# Patient Record
Sex: Female | Born: 1935 | Race: White | Hispanic: No | Smoking: Never smoker
Health system: Southern US, Community
[De-identification: ages and names within clinical notes are randomized; demographics above are authoritative.]

## PROBLEM LIST (undated history)

## (undated) DIAGNOSIS — I10 Essential (primary) hypertension: Secondary | ICD-10-CM

---

## 2021-05-30 DIAGNOSIS — E871 Hypo-osmolality and hyponatremia: Secondary | ICD-10-CM

## 2021-07-29 ENCOUNTER — Ambulatory Visit (INDEPENDENT_AMBULATORY_CARE_PROVIDER_SITE_OTHER): Payer: Medicare Other | Admitting: Sports Medicine

## 2021-07-29 ENCOUNTER — Encounter: Payer: Self-pay | Admitting: Sports Medicine

## 2021-07-29 ENCOUNTER — Other Ambulatory Visit: Payer: Self-pay

## 2021-07-29 DIAGNOSIS — Z7409 Other reduced mobility: Secondary | ICD-10-CM | POA: Insufficient documentation

## 2021-07-29 DIAGNOSIS — R0989 Other specified symptoms and signs involving the circulatory and respiratory systems: Secondary | ICD-10-CM | POA: Insufficient documentation

## 2021-07-29 DIAGNOSIS — H814 Vertigo of central origin: Secondary | ICD-10-CM | POA: Insufficient documentation

## 2021-07-29 DIAGNOSIS — M179 Osteoarthritis of knee, unspecified: Secondary | ICD-10-CM | POA: Insufficient documentation

## 2021-07-29 DIAGNOSIS — E782 Mixed hyperlipidemia: Secondary | ICD-10-CM | POA: Insufficient documentation

## 2021-07-29 DIAGNOSIS — I10 Essential (primary) hypertension: Secondary | ICD-10-CM | POA: Insufficient documentation

## 2021-07-29 DIAGNOSIS — M5136 Other intervertebral disc degeneration, lumbar region: Secondary | ICD-10-CM | POA: Insufficient documentation

## 2021-07-29 DIAGNOSIS — G894 Chronic pain syndrome: Secondary | ICD-10-CM | POA: Insufficient documentation

## 2021-07-29 DIAGNOSIS — I739 Peripheral vascular disease, unspecified: Secondary | ICD-10-CM | POA: Diagnosis not present

## 2021-07-29 DIAGNOSIS — L600 Ingrowing nail: Secondary | ICD-10-CM

## 2021-07-29 DIAGNOSIS — Z9181 History of falling: Secondary | ICD-10-CM | POA: Insufficient documentation

## 2021-07-29 DIAGNOSIS — G4700 Insomnia, unspecified: Secondary | ICD-10-CM | POA: Insufficient documentation

## 2021-07-29 DIAGNOSIS — I872 Venous insufficiency (chronic) (peripheral): Secondary | ICD-10-CM | POA: Insufficient documentation

## 2021-07-29 DIAGNOSIS — L89151 Pressure ulcer of sacral region, stage 1: Secondary | ICD-10-CM | POA: Insufficient documentation

## 2021-07-29 DIAGNOSIS — M71552 Other bursitis, not elsewhere classified, left hip: Secondary | ICD-10-CM | POA: Insufficient documentation

## 2021-07-29 DIAGNOSIS — F411 Generalized anxiety disorder: Secondary | ICD-10-CM | POA: Insufficient documentation

## 2021-07-29 DIAGNOSIS — J309 Allergic rhinitis, unspecified: Secondary | ICD-10-CM | POA: Insufficient documentation

## 2021-07-29 DIAGNOSIS — G2581 Restless legs syndrome: Secondary | ICD-10-CM | POA: Insufficient documentation

## 2021-07-29 DIAGNOSIS — L03032 Cellulitis of left toe: Secondary | ICD-10-CM

## 2021-07-29 DIAGNOSIS — M5416 Radiculopathy, lumbar region: Secondary | ICD-10-CM | POA: Insufficient documentation

## 2021-07-29 DIAGNOSIS — E1142 Type 2 diabetes mellitus with diabetic polyneuropathy: Secondary | ICD-10-CM

## 2021-07-29 DIAGNOSIS — D518 Other vitamin B12 deficiency anemias: Secondary | ICD-10-CM | POA: Insufficient documentation

## 2021-07-29 DIAGNOSIS — E559 Vitamin D deficiency, unspecified: Secondary | ICD-10-CM | POA: Insufficient documentation

## 2021-07-29 DIAGNOSIS — L0390 Cellulitis, unspecified: Secondary | ICD-10-CM | POA: Insufficient documentation

## 2021-07-29 DIAGNOSIS — E039 Hypothyroidism, unspecified: Secondary | ICD-10-CM | POA: Insufficient documentation

## 2021-07-29 DIAGNOSIS — M1710 Unilateral primary osteoarthritis, unspecified knee: Secondary | ICD-10-CM | POA: Insufficient documentation

## 2021-07-29 DIAGNOSIS — K219 Gastro-esophageal reflux disease without esophagitis: Secondary | ICD-10-CM | POA: Insufficient documentation

## 2021-07-29 DIAGNOSIS — E119 Type 2 diabetes mellitus without complications: Secondary | ICD-10-CM | POA: Insufficient documentation

## 2021-07-29 DIAGNOSIS — Z79811 Long term (current) use of aromatase inhibitors: Secondary | ICD-10-CM | POA: Insufficient documentation

## 2021-07-29 DIAGNOSIS — R609 Edema, unspecified: Secondary | ICD-10-CM | POA: Insufficient documentation

## 2021-07-29 DIAGNOSIS — R601 Generalized edema: Secondary | ICD-10-CM | POA: Insufficient documentation

## 2021-07-29 MED ORDER — NEOMYCIN-POLYMYXIN-HC 3.5-10000-1 OT SOLN: OTIC | 0 refills | Status: DC

## 2021-07-29 NOTE — Progress Notes (Signed)
Subjective: Jill Ochoa is a 87 y.o. female patient with history of diabetes who presents to office today complaining of pain at the left great toenail states that it has been swelling and has some dry crust over the nail and nailbed states that at the nursing facility they have put her on 2 rounds of antibiotics because of her nail issue and states that it seems not to be getting better.  Patient does not know how this is happened states that she usually trims her own toenails but since she has been in the facility has not states that she does wear compression garments and sometimes the garments hurt her toes.  Patient denies any other pedal complaints at this time.  Patient is assisted by son this visit.  Patient Active Problem List   Diagnosis Date Noted   Allergic rhinitis 07/29/2021   Cardiovascular symptoms 07/29/2021   Cellulitis 07/29/2021   Chronic pain syndrome 07/29/2021   Edema 07/29/2021   Essential hypertension 07/29/2021   Gastro-esophageal reflux disease without esophagitis 07/29/2021   Generalized anxiety disorder 07/29/2021   Generalized edema 07/29/2021   History of fall 07/29/2021   Hypothyroidism 07/29/2021   Insomnia 07/29/2021   Long term (current) use of aromatase inhibitors 07/29/2021   Lumbar radicular pain 07/29/2021   Mixed hyperlipidemia 07/29/2021   Osteoarthritis of knee 07/29/2021   Other bursitis, not elsewhere classified, left hip 07/29/2021   Other intervertebral disc degeneration, lumbar region 07/29/2021   Other vitamin B12 deficiency anemias 07/29/2021   Peripheral vascular disease (HCC) 07/29/2021   Peripheral venous insufficiency 07/29/2021   Pressure ulcer of sacral region, stage 1 07/29/2021   Reduced mobility 07/29/2021   Restless legs syndrome 07/29/2021   Type 2 diabetes mellitus without complications (HCC) 07/29/2021   Vertigo of central origin 07/29/2021   Vitamin D deficiency 07/29/2021   Current Outpatient Medications on  File Prior to Visit  Medication Sig Dispense Refill   meclizine (ANTIVERT) 25 MG tablet 1 tablet as needed     metFORMIN (GLUCOPHAGE) 500 MG tablet 1 tablet with a meal     predniSONE (DELTASONE) 20 MG tablet 1 tablet     acyclovir (ZOVIRAX) 400 MG tablet Take 400 mg by mouth daily.     ALPRAZolam (XANAX) 1 MG tablet Take 1 mg by mouth 2 (two) times daily as needed.     amLODipine (NORVASC) 5 MG tablet Take 5 mg by mouth daily.     B Complex Vitamins (VITAMIN B COMPLEX) TABS Take 1 tablet by mouth daily.     furosemide (LASIX) 20 MG tablet Take 20 mg by mouth daily.     gabapentin (NEURONTIN) 800 MG tablet Take 800 mg by mouth 3 (three) times daily.     ibuprofen (ADVIL) 800 MG tablet Take 800 mg by mouth 3 (three) times daily.     lisinopril (ZESTRIL) 30 MG tablet Take 30 mg by mouth daily.     meloxicam (MOBIC) 15 MG tablet 1 tablet     nirmatrelvir & ritonavir (PAXLOVID) 20 x 150 MG & 10 x 100MG  TBPK Take 3 tablets by mouth 2 (two) times daily.     omeprazole (PRILOSEC) 40 MG capsule Take 40 mg by mouth daily.     phenazopyridine (PYRIDIUM) 200 MG tablet Take 200 mg by mouth every 8 (eight) hours as needed.     traMADol (ULTRAM) 50 MG tablet SMARTSIG:1 Tablet(s) By Mouth Every 12 Hours PRN     No current facility-administered medications on file  prior to visit.   Allergies  Allergen Reactions   Tape     No results found for this or any previous visit (from the past 2160 hour(s)).  Objective: General: Patient is awake, alert, and oriented x 3 and in no acute distress.  Integument: Skin is warm, dry and supple bilateral.  Left hallux nail has keratotic skin at the lateral nail fold with dried crusted blood to the area consistent with paronychia there is no significant erythema but patient has diffuse 2+ pitting edema bilateral. No open lesions or preulcerative lesions present bilateral. Remaining integument unremarkable.  Vasculature:  Dorsalis Pedis pulse 0/4 bilateral. Posterior  Tibial pulse  0/4 bilateral. Capillary fill time <5 sec 1-5 bilateral.  No hair growth to the level of the digits. Temperature gradient within normal limits.  Moderate varicosities present bilateral.  2+ pitting edema present bilateral.   Neurology: The patient has diminished sensation bilateral.  Musculoskeletal: Minimal tenderness to palpation to left great toe and no obvious symptomatic bony deformities noted bilateral.    Assessment and Plan: Problem List Items Addressed This Visit   None Visit Diagnoses     Paronychia of great toe, left    -  Primary   Relevant Medications   acyclovir (ZOVIRAX) 400 MG tablet   nirmatrelvir & ritonavir (PAXLOVID) 20 x 150 MG & 10 x 100MG  TBPK   Ingrown nail       PVD (peripheral vascular disease) (HCC)       Relevant Medications   amLODipine (NORVASC) 5 MG tablet   furosemide (LASIX) 20 MG tablet   lisinopril (ZESTRIL) 30 MG tablet   Diabetic polyneuropathy associated with type 2 diabetes mellitus (HCC)       Relevant Medications   ALPRAZolam (XANAX) 1 MG tablet   gabapentin (NEURONTIN) 800 MG tablet   lisinopril (ZESTRIL) 30 MG tablet   metFORMIN (GLUCOPHAGE) 500 MG tablet       -Examined patient. -Discussed with patient treatment options for ingrowing nail with infection that is localized to the area patient is not a candidate for a very aggressive nail procedure due to PVD so I did perform a nail trimming and gently remove the offending nail border at the left lateral hallux using a sterile nail nipper -Patient tolerated the nail debridement procedure well without incident -Applied Neosporin and Band-Aid to left hallux lateral margin -Rx Corticosporin solution for the facility to apply to the toe once daily until healed -Advised son to change her compression garments to an open toe to prevent rubbing and reoccurrence of ingrown -Answered all patient questions -Patient to return in 1 month for toe check -Patient advised to call the  office if any problems or questions arise in the meantime.  , DPM

## 2021-08-26 ENCOUNTER — Encounter: Payer: Self-pay | Admitting: Sports Medicine

## 2021-08-26 ENCOUNTER — Other Ambulatory Visit: Payer: Self-pay

## 2021-08-26 ENCOUNTER — Ambulatory Visit (INDEPENDENT_AMBULATORY_CARE_PROVIDER_SITE_OTHER): Payer: Medicare Other | Admitting: Sports Medicine

## 2021-08-26 DIAGNOSIS — B351 Tinea unguium: Secondary | ICD-10-CM

## 2021-08-26 DIAGNOSIS — M79676 Pain in unspecified toe(s): Secondary | ICD-10-CM

## 2021-08-26 DIAGNOSIS — M79609 Pain in unspecified limb: Secondary | ICD-10-CM

## 2021-08-26 DIAGNOSIS — E1142 Type 2 diabetes mellitus with diabetic polyneuropathy: Secondary | ICD-10-CM

## 2021-08-26 DIAGNOSIS — I739 Peripheral vascular disease, unspecified: Secondary | ICD-10-CM

## 2021-08-26 NOTE — Progress Notes (Signed)
Subjective: Jill Ochoa is a 87 y.o. female patient with history of diabetes who presents to office today complaining of long,mildly painful nails  while ambulating in shoes; unable to trim. Patient states that the left great toe is doing better does not hurt or bother her since after last visit.  Patient Active Problem List   Diagnosis Date Noted   Allergic rhinitis 07/29/2021   Cardiovascular symptoms 07/29/2021   Cellulitis 07/29/2021   Chronic pain syndrome 07/29/2021   Edema 07/29/2021   Essential hypertension 07/29/2021   Gastro-esophageal reflux disease without esophagitis 07/29/2021   Generalized anxiety disorder 07/29/2021   Generalized edema 07/29/2021   History of fall 07/29/2021   Hypothyroidism 07/29/2021   Insomnia 07/29/2021   Long term (current) use of aromatase inhibitors 07/29/2021   Lumbar radicular pain 07/29/2021   Mixed hyperlipidemia 07/29/2021   Osteoarthritis of knee 07/29/2021   Other bursitis, not elsewhere classified, left hip 07/29/2021   Other intervertebral disc degeneration, lumbar region 07/29/2021   Other vitamin B12 deficiency anemias 07/29/2021   Peripheral vascular disease (HCC) 07/29/2021   Peripheral venous insufficiency 07/29/2021   Pressure ulcer of sacral region, stage 1 07/29/2021   Reduced mobility 07/29/2021   Restless legs syndrome 07/29/2021   Type 2 diabetes mellitus without complications (HCC) 07/29/2021   Vertigo of central origin 07/29/2021   Vitamin D deficiency 07/29/2021   Current Outpatient Medications on File Prior to Visit  Medication Sig Dispense Refill   acyclovir (ZOVIRAX) 400 MG tablet Take 400 mg by mouth daily.     ALPRAZolam (XANAX) 1 MG tablet Take 1 mg by mouth 2 (two) times daily as needed.     amLODipine (NORVASC) 5 MG tablet Take 5 mg by mouth daily.     B Complex Vitamins (VITAMIN B COMPLEX) TABS Take 1 tablet by mouth daily.     furosemide (LASIX) 20 MG tablet Take 20 mg by mouth daily.      gabapentin (NEURONTIN) 800 MG tablet Take 800 mg by mouth 3 (three) times daily.     ibuprofen (ADVIL) 800 MG tablet Take 800 mg by mouth 3 (three) times daily.     lisinopril (ZESTRIL) 30 MG tablet Take 30 mg by mouth daily.     meclizine (ANTIVERT) 25 MG tablet 1 tablet as needed     meloxicam (MOBIC) 15 MG tablet 1 tablet     metFORMIN (GLUCOPHAGE) 500 MG tablet 1 tablet with a meal     neomycin-polymyxin-hydrocortisone (CORTISPORIN) OTIC solution Apply 1-2 drops to left 1st toenail then cover with a bandaid once daily until healed 10 mL 0   nirmatrelvir & ritonavir (PAXLOVID) 20 x 150 MG & 10 x 100MG  TBPK Take 3 tablets by mouth 2 (two) times daily.     omeprazole (PRILOSEC) 40 MG capsule Take 40 mg by mouth daily.     phenazopyridine (PYRIDIUM) 200 MG tablet Take 200 mg by mouth every 8 (eight) hours as needed.     predniSONE (DELTASONE) 20 MG tablet 1 tablet     traMADol (ULTRAM) 50 MG tablet SMARTSIG:1 Tablet(s) By Mouth Every 12 Hours PRN     No current facility-administered medications on file prior to visit.   Allergies  Allergen Reactions   Tape     No results found for this or any previous visit (from the past 2160 hour(s)).  Objective: General: Patient is awake, alert, and oriented x 3 and in no acute distress.  Integument: Skin is warm, dry and supple bilateral.  Nails are tender, long, thickened and dystrophic with subungual debris, consistent with onychomycosis, 1-5 bilateral.  No acute ingrowing noted on the left hallux previous paronychia is resolved.  No signs of infection. No open lesions or preulcerative lesions present bilateral. Remaining integument unremarkable.  Vasculature:  Dorsalis Pedis pulse 0/4 bilateral. Posterior Tibial pulse 0/4 bilateral. Capillary fill time <5 sec 1-5 bilateral.  No hair growth to the level of the digits.Temperature gradient within normal limits.  Moderate varicosities present bilateral.  Trace edema present bilateral.   Neurology:  Gross sensation present via light touch  Musculoskeletal: Asymptomatic bunion and hammertoes deformities right greater than left.  Assessment and Plan: Problem List Items Addressed This Visit   None Visit Diagnoses     Pain due to onychomycosis of nail    -  Primary   PVD (peripheral vascular disease) (HCC)       Diabetic polyneuropathy associated with type 2 diabetes mellitus (HCC)           -Examined patient. -Discussed and educated patient on diabetic foot care, especially with  regards to the vascular, neurological and musculoskeletal systems.  -Stressed the importance of good glycemic control and the detriment of not  controlling glucose levels in relation to the foot. -Mechanically debrided all nails 1-5 bilateral using sterile nail nipper and filed with dremel without incident  -Left great toe is now healed patient no longer needs to use Corticosporin to the area -Answered all patient questions -Patient to return  in 3 months for at risk foot care -Patient advised to call the office if any problems or questions arise in the meantime.  Asencion Islam, DPM

## 2021-11-25 ENCOUNTER — Ambulatory Visit (INDEPENDENT_AMBULATORY_CARE_PROVIDER_SITE_OTHER): Payer: Medicare Other | Admitting: Sports Medicine

## 2021-11-25 ENCOUNTER — Encounter: Payer: Self-pay | Admitting: Sports Medicine

## 2021-11-25 DIAGNOSIS — I739 Peripheral vascular disease, unspecified: Secondary | ICD-10-CM | POA: Diagnosis not present

## 2021-11-25 DIAGNOSIS — E1142 Type 2 diabetes mellitus with diabetic polyneuropathy: Secondary | ICD-10-CM | POA: Diagnosis not present

## 2021-11-25 DIAGNOSIS — M79609 Pain in unspecified limb: Secondary | ICD-10-CM | POA: Diagnosis not present

## 2021-11-25 DIAGNOSIS — B351 Tinea unguium: Secondary | ICD-10-CM | POA: Diagnosis not present

## 2021-11-25 NOTE — Progress Notes (Signed)
Subjective: Jill Ochoa is a 87 y.o. female patient with history of diabetes who presents to office today complaining of long,mildly painful nails  while ambulating in shoes; unable to trim.  Patient Active Problem List   Diagnosis Date Noted   Allergic rhinitis 07/29/2021   Cardiovascular symptoms 07/29/2021   Cellulitis 07/29/2021   Chronic pain syndrome 07/29/2021   Edema 07/29/2021   Essential hypertension 07/29/2021   Gastro-esophageal reflux disease without esophagitis 07/29/2021   Generalized anxiety disorder 07/29/2021   Generalized edema 07/29/2021   History of fall 07/29/2021   Hypothyroidism 07/29/2021   Insomnia 07/29/2021   Long term (current) use of aromatase inhibitors 07/29/2021   Lumbar radicular pain 07/29/2021   Mixed hyperlipidemia 07/29/2021   Osteoarthritis of knee 07/29/2021   Other bursitis, not elsewhere classified, left hip 07/29/2021   Other intervertebral disc degeneration, lumbar region 07/29/2021   Other vitamin B12 deficiency anemias 07/29/2021   Peripheral vascular disease (HCC) 07/29/2021   Peripheral venous insufficiency 07/29/2021   Pressure ulcer of sacral region, stage 1 07/29/2021   Reduced mobility 07/29/2021   Restless legs syndrome 07/29/2021   Type 2 diabetes mellitus without complications (HCC) 07/29/2021   Vertigo of central origin 07/29/2021   Vitamin D deficiency 07/29/2021   Current Outpatient Medications on File Prior to Visit  Medication Sig Dispense Refill   acyclovir (ZOVIRAX) 400 MG tablet Take 400 mg by mouth daily.     ALPRAZolam (XANAX) 1 MG tablet Take 1 mg by mouth 2 (two) times daily as needed.     amLODipine (NORVASC) 5 MG tablet Take 5 mg by mouth daily.     B Complex Vitamins (VITAMIN B COMPLEX) TABS Take 1 tablet by mouth daily.     furosemide (LASIX) 20 MG tablet Take 20 mg by mouth daily.     gabapentin (NEURONTIN) 800 MG tablet Take 800 mg by mouth 3 (three) times daily.     ibuprofen (ADVIL) 800 MG  tablet Take 800 mg by mouth 3 (three) times daily.     lisinopril (ZESTRIL) 30 MG tablet Take 30 mg by mouth daily.     meclizine (ANTIVERT) 25 MG tablet 1 tablet as needed     meloxicam (MOBIC) 15 MG tablet 1 tablet     metFORMIN (GLUCOPHAGE) 500 MG tablet 1 tablet with a meal     neomycin-polymyxin-hydrocortisone (CORTISPORIN) OTIC solution Apply 1-2 drops to left 1st toenail then cover with a bandaid once daily until healed 10 mL 0   nirmatrelvir & ritonavir (PAXLOVID) 20 x 150 MG & 10 x 100MG  TBPK Take 3 tablets by mouth 2 (two) times daily.     omeprazole (PRILOSEC) 40 MG capsule Take 40 mg by mouth daily.     phenazopyridine (PYRIDIUM) 200 MG tablet Take 200 mg by mouth every 8 (eight) hours as needed.     predniSONE (DELTASONE) 20 MG tablet 1 tablet     traMADol (ULTRAM) 50 MG tablet SMARTSIG:1 Tablet(s) By Mouth Every 12 Hours PRN     No current facility-administered medications on file prior to visit.   Allergies  Allergen Reactions   Tape     No results found for this or any previous visit (from the past 2160 hour(s)).  Objective: General: Patient is awake, alert, and oriented x 3 and in no acute distress.  Integument: Skin is warm, dry and supple bilateral. Nails are tender, long, thickened and dystrophic with subungual debris, consistent with onychomycosis, 1-5 bilateral.  No signs of infection.  No open lesions or preulcerative lesions present bilateral. Remaining integument unremarkable.  Vasculature:  Dorsalis Pedis pulse 0/4 bilateral. Posterior Tibial pulse 0/4 bilateral. Capillary fill time <5 sec 1-5 bilateral.  No hair growth to the level of the digits.Temperature gradient within normal limits.  Moderate varicosities present bilateral.  Trace edema present bilateral.   Neurology: Gross sensation present via light touch  Musculoskeletal: Asymptomatic bunion and hammertoes deformities right greater than left.  Assessment and Plan: Problem List Items Addressed This  Visit   None Visit Diagnoses     Pain due to onychomycosis of nail    -  Primary   PVD (peripheral vascular disease) ([place])       Diabetic polyneuropathy associated with type 2 diabetes mellitus ([place])           -Examined patient. -Re-Discussed and educated patient on diabetic foot care, especially with  regards to the vascular, neurological and musculoskeletal systems.  -Mechanically debrided all nails 1-5 bilateral using sterile nail nipper and filed with dremel without incident  -Answered all patient questions -Patient to return  in 6 months for at risk foot care -Patient advised to call the office if any problems or questions arise in the meantime.  Landis Martins, DPM

## 2021-12-01 ENCOUNTER — Inpatient Hospital Stay (HOSPITAL_COMMUNITY)
Admission: EM | Admit: 2021-12-01 | Discharge: 2021-12-03 | DRG: 202 | Disposition: A | Discharge status: Home Health | Payer: Medicare Other | Attending: Internal Medicine | Admitting: Internal Medicine

## 2021-12-01 ENCOUNTER — Other Ambulatory Visit: Payer: Self-pay

## 2021-12-01 ENCOUNTER — Emergency Department (HOSPITAL_COMMUNITY): Payer: Medicare Other

## 2021-12-01 ENCOUNTER — Encounter (HOSPITAL_COMMUNITY): Payer: Self-pay

## 2021-12-01 DIAGNOSIS — E1151 Type 2 diabetes mellitus with diabetic peripheral angiopathy without gangrene: Secondary | ICD-10-CM | POA: Diagnosis present

## 2021-12-01 DIAGNOSIS — I447 Left bundle-branch block, unspecified: Secondary | ICD-10-CM | POA: Diagnosis present

## 2021-12-01 DIAGNOSIS — R0602 Shortness of breath: Secondary | ICD-10-CM

## 2021-12-01 DIAGNOSIS — E860 Dehydration: Secondary | ICD-10-CM | POA: Diagnosis present

## 2021-12-01 DIAGNOSIS — J209 Acute bronchitis, unspecified: Secondary | ICD-10-CM | POA: Diagnosis not present

## 2021-12-01 DIAGNOSIS — I10 Essential (primary) hypertension: Secondary | ICD-10-CM | POA: Diagnosis present

## 2021-12-01 DIAGNOSIS — Z91048 Other nonmedicinal substance allergy status: Secondary | ICD-10-CM

## 2021-12-01 DIAGNOSIS — N179 Acute kidney failure, unspecified: Secondary | ICD-10-CM | POA: Diagnosis present

## 2021-12-01 DIAGNOSIS — E782 Mixed hyperlipidemia: Secondary | ICD-10-CM | POA: Diagnosis present

## 2021-12-01 DIAGNOSIS — I872 Venous insufficiency (chronic) (peripheral): Secondary | ICD-10-CM | POA: Diagnosis present

## 2021-12-01 DIAGNOSIS — Z7984 Long term (current) use of oral hypoglycemic drugs: Secondary | ICD-10-CM

## 2021-12-01 DIAGNOSIS — G894 Chronic pain syndrome: Secondary | ICD-10-CM | POA: Diagnosis present

## 2021-12-01 DIAGNOSIS — E039 Hypothyroidism, unspecified: Secondary | ICD-10-CM | POA: Diagnosis present

## 2021-12-01 DIAGNOSIS — E1165 Type 2 diabetes mellitus with hyperglycemia: Secondary | ICD-10-CM | POA: Diagnosis present

## 2021-12-01 DIAGNOSIS — D649 Anemia, unspecified: Secondary | ICD-10-CM | POA: Diagnosis present

## 2021-12-01 DIAGNOSIS — E871 Hypo-osmolality and hyponatremia: Secondary | ICD-10-CM | POA: Diagnosis present

## 2021-12-01 DIAGNOSIS — Z20822 Contact with and (suspected) exposure to covid-19: Secondary | ICD-10-CM | POA: Diagnosis present

## 2021-12-01 DIAGNOSIS — Z79891 Long term (current) use of opiate analgesic: Secondary | ICD-10-CM

## 2021-12-01 DIAGNOSIS — E878 Other disorders of electrolyte and fluid balance, not elsewhere classified: Secondary | ICD-10-CM | POA: Diagnosis present

## 2021-12-01 DIAGNOSIS — G2581 Restless legs syndrome: Secondary | ICD-10-CM | POA: Diagnosis present

## 2021-12-01 DIAGNOSIS — J309 Allergic rhinitis, unspecified: Secondary | ICD-10-CM | POA: Diagnosis present

## 2021-12-01 DIAGNOSIS — Z90710 Acquired absence of both cervix and uterus: Secondary | ICD-10-CM

## 2021-12-01 DIAGNOSIS — Z79899 Other long term (current) drug therapy: Secondary | ICD-10-CM

## 2021-12-01 DIAGNOSIS — F411 Generalized anxiety disorder: Secondary | ICD-10-CM | POA: Diagnosis present

## 2021-12-01 DIAGNOSIS — T380X5A Adverse effect of glucocorticoids and synthetic analogues, initial encounter: Secondary | ICD-10-CM | POA: Diagnosis present

## 2021-12-01 DIAGNOSIS — Z7952 Long term (current) use of systemic steroids: Secondary | ICD-10-CM

## 2021-12-01 HISTORY — DX: Essential (primary) hypertension: I10

## 2021-12-01 LAB — COMPREHENSIVE METABOLIC PANEL
ALT: 13 U/L (ref 0–44)
AST: 20 U/L (ref 15–41)
Albumin: 3.1 g/dL — ABNORMAL LOW (ref 3.5–5.0)
Alkaline Phosphatase: 73 U/L (ref 38–126)
Anion gap: 11 (ref 5–15)
BUN: 21 mg/dL (ref 8–23)
CO2: 27 mmol/L (ref 22–32)
Calcium: 8.4 mg/dL — ABNORMAL LOW (ref 8.9–10.3)
Chloride: 94 mmol/L — ABNORMAL LOW (ref 98–111)
Creatinine, Ser: 1.22 mg/dL — ABNORMAL HIGH (ref 0.44–1.00)
GFR, Estimated: 42 mL/min — ABNORMAL LOW (ref >60)
Glucose, Bld: 176 mg/dL — ABNORMAL HIGH (ref 70–99)
Potassium: 3.5 mmol/L (ref 3.5–5.1)
Sodium: 132 mmol/L — ABNORMAL LOW (ref 135–145)
Total Bilirubin: 0.5 mg/dL (ref 0.3–1.2)
Total Protein: 5.7 g/dL — ABNORMAL LOW (ref 6.5–8.1)

## 2021-12-01 LAB — RESP PANEL BY RT-PCR (FLU A&B, COVID) ARPGX2
Influenza A by PCR: NEGATIVE
Influenza B by PCR: NEGATIVE
SARS Coronavirus 2 by RT PCR: NEGATIVE

## 2021-12-01 LAB — CBC WITH DIFFERENTIAL/PLATELET
Abs Immature Granulocytes: 0.24 10*3/uL — ABNORMAL HIGH (ref 0.00–0.07)
Basophils Absolute: 0 10*3/uL (ref 0.0–0.1)
Basophils Relative: 0 %
Eosinophils Absolute: 0 10*3/uL (ref 0.0–0.5)
Eosinophils Relative: 0 %
HCT: 36.1 % (ref 36.0–46.0)
Hemoglobin: 11.9 g/dL — ABNORMAL LOW (ref 12.0–15.0)
Immature Granulocytes: 2 %
Lymphocytes Relative: 8 %
Lymphs Abs: 1.2 10*3/uL (ref 0.7–4.0)
MCH: 29.5 pg (ref 26.0–34.0)
MCHC: 33 g/dL (ref 30.0–36.0)
MCV: 89.6 fL (ref 80.0–100.0)
Monocytes Absolute: 0.4 10*3/uL (ref 0.1–1.0)
Monocytes Relative: 3 %
Neutro Abs: 12.9 10*3/uL — ABNORMAL HIGH (ref 1.7–7.7)
Neutrophils Relative %: 87 %
Platelets: 242 10*3/uL (ref 150–400)
RBC: 4.03 MIL/uL (ref 3.87–5.11)
RDW: 15.4 % (ref 11.5–15.5)
WBC: 14.8 10*3/uL — ABNORMAL HIGH (ref 4.0–10.5)
nRBC: 0 % (ref 0.0–0.2)

## 2021-12-01 LAB — I-STAT VENOUS BLOOD GAS, ED
Acid-Base Excess: 5 mmol/L — ABNORMAL HIGH (ref 0.0–2.0)
Bicarbonate: 29 mmol/L — ABNORMAL HIGH (ref 20.0–28.0)
Calcium, Ion: 1.02 mmol/L — ABNORMAL LOW (ref 1.15–1.40)
HCT: 36 % (ref 36.0–46.0)
Hemoglobin: 12.2 g/dL (ref 12.0–15.0)
O2 Saturation: 85 %
Potassium: 3.5 mmol/L (ref 3.5–5.1)
Sodium: 132 mmol/L — ABNORMAL LOW (ref 135–145)
TCO2: 30 mmol/L (ref 22–32)
pCO2, Ven: 38.3 mmHg — ABNORMAL LOW (ref 44.0–60.0)
pH, Ven: 7.487 — ABNORMAL HIGH (ref 7.250–7.430)
pO2, Ven: 46 mmHg — ABNORMAL HIGH (ref 32.0–45.0)

## 2021-12-01 LAB — TROPONIN I (HIGH SENSITIVITY)
Troponin I (High Sensitivity): 19 ng/L — ABNORMAL HIGH (ref <18)
Troponin I (High Sensitivity): 16 ng/L (ref <18)

## 2021-12-01 MED ORDER — BUDESONIDE 0.25 MG/2ML IN SUSP
0.2500 mg | Freq: Two times a day (BID) | RESPIRATORY_TRACT | Status: DC
  Administered 2021-12-01 – 2021-12-03 (×4): 0.25 mg via RESPIRATORY_TRACT
  Filled 2021-12-01 (×6): qty 2

## 2021-12-01 MED ORDER — ACETAMINOPHEN 325 MG PO TABS: 650.0000 mg | ORAL_TABLET | Freq: Four times a day (QID) | ORAL | Status: DC | PRN

## 2021-12-01 MED ORDER — ALBUTEROL SULFATE (2.5 MG/3ML) 0.083% IN NEBU: 2.5000 mg | INHALATION_SOLUTION | RESPIRATORY_TRACT | Status: DC | PRN

## 2021-12-01 MED ORDER — B COMPLEX-C PO TABS
1.0000 | ORAL_TABLET | Freq: Every day | ORAL | Status: DC
  Administered 2021-12-02 – 2021-12-03 (×2): 1 via ORAL
  Filled 2021-12-01 (×2): qty 1

## 2021-12-01 MED ORDER — ENOXAPARIN SODIUM 40 MG/0.4ML IJ SOSY
40.0000 mg | PREFILLED_SYRINGE | Freq: Every day | INTRAMUSCULAR | Status: DC
  Administered 2021-12-01 – 2021-12-02 (×2): 40 mg via SUBCUTANEOUS
  Filled 2021-12-01 (×2): qty 0.4

## 2021-12-01 MED ORDER — TRAMADOL HCL 50 MG PO TABS: 50.0000 mg | ORAL_TABLET | Freq: Four times a day (QID) | ORAL | Status: DC | PRN

## 2021-12-01 MED ORDER — INSULIN ASPART 100 UNIT/ML IJ SOLN
0.0000 [IU] | Freq: Three times a day (TID) | INTRAMUSCULAR | Status: DC
  Administered 2021-12-02: 10:00:00 3 [IU] via SUBCUTANEOUS
  Administered 2021-12-02: 13:00:00 2 [IU] via SUBCUTANEOUS
  Administered 2021-12-03: 09:00:00 1 [IU] via SUBCUTANEOUS

## 2021-12-01 MED ORDER — MECLIZINE HCL 25 MG PO TABS
25.0000 mg | ORAL_TABLET | Freq: Two times a day (BID) | ORAL | Status: DC | PRN
  Filled 2021-12-01: qty 1

## 2021-12-01 MED ORDER — AMLODIPINE BESYLATE 5 MG PO TABS
5.0000 mg | ORAL_TABLET | Freq: Every day | ORAL | Status: DC
  Administered 2021-12-02 – 2021-12-03 (×2): 5 mg via ORAL
  Filled 2021-12-01 (×2): qty 1

## 2021-12-01 MED ORDER — PANTOPRAZOLE SODIUM 40 MG PO TBEC
40.0000 mg | DELAYED_RELEASE_TABLET | Freq: Every day | ORAL | Status: DC
  Administered 2021-12-02 – 2021-12-03 (×2): 40 mg via ORAL
  Filled 2021-12-01 (×2): qty 1

## 2021-12-01 MED ORDER — HYDRALAZINE HCL 20 MG/ML IJ SOLN: 10.0000 mg | INTRAMUSCULAR | Status: DC | PRN

## 2021-12-01 MED ORDER — ACYCLOVIR 400 MG PO TABS
400.0000 mg | ORAL_TABLET | Freq: Every day | ORAL | Status: DC
  Administered 2021-12-02 – 2021-12-03 (×2): 400 mg via ORAL
  Filled 2021-12-01 (×2): qty 1

## 2021-12-01 MED ORDER — ALBUTEROL SULFATE (2.5 MG/3ML) 0.083% IN NEBU
2.5000 mg | INHALATION_SOLUTION | RESPIRATORY_TRACT | Status: DC
  Administered 2021-12-02 (×2): 2.5 mg via RESPIRATORY_TRACT
  Filled 2021-12-01 (×3): qty 3

## 2021-12-01 MED ORDER — METHYLPREDNISOLONE SODIUM SUCC 125 MG IJ SOLR
80.0000 mg | INTRAMUSCULAR | Status: DC
  Administered 2021-12-02: 05:00:00 80 mg via INTRAVENOUS
  Filled 2021-12-01: qty 2

## 2021-12-01 MED ORDER — ACETAMINOPHEN 650 MG RE SUPP: 650.0000 mg | Freq: Four times a day (QID) | RECTAL | Status: DC | PRN

## 2021-12-01 MED ORDER — IPRATROPIUM-ALBUTEROL 0.5-2.5 (3) MG/3ML IN SOLN
3.0000 mL | Freq: Once | RESPIRATORY_TRACT | Status: AC
  Administered 2021-12-01: 20:00:00 3 mL via RESPIRATORY_TRACT
  Filled 2021-12-01: qty 3

## 2021-12-01 MED ORDER — CALCIUM CARBONATE ANTACID 500 MG PO CHEW
1.0000 | CHEWABLE_TABLET | Freq: Three times a day (TID) | ORAL | Status: AC
  Administered 2021-12-01 – 2021-12-02 (×3): 200 mg via ORAL
  Filled 2021-12-01 (×2): qty 1

## 2021-12-01 MED ORDER — GABAPENTIN 400 MG PO CAPS
800.0000 mg | ORAL_CAPSULE | Freq: Three times a day (TID) | ORAL | Status: DC
  Administered 2021-12-01 – 2021-12-03 (×5): 800 mg via ORAL
  Filled 2021-12-01 (×5): qty 2

## 2021-12-01 MED ORDER — IPRATROPIUM-ALBUTEROL 0.5-2.5 (3) MG/3ML IN SOLN
3.0000 mL | Freq: Once | RESPIRATORY_TRACT | Status: AC
  Administered 2021-12-01: 17:00:00 3 mL via RESPIRATORY_TRACT
  Filled 2021-12-01: qty 3

## 2021-12-01 MED ORDER — IPRATROPIUM BROMIDE 0.02 % IN SOLN
0.5000 mg | RESPIRATORY_TRACT | Status: DC
  Administered 2021-12-02 (×2): 0.5 mg via RESPIRATORY_TRACT
  Filled 2021-12-01 (×3): qty 2.5

## 2021-12-01 MED ORDER — METHYLPREDNISOLONE SODIUM SUCC 125 MG IJ SOLR
125.0000 mg | Freq: Every day | INTRAMUSCULAR | Status: DC
  Administered 2021-12-01: 17:00:00 125 mg via INTRAVENOUS
  Filled 2021-12-01: qty 2

## 2021-12-01 MED ORDER — ALPRAZOLAM 0.5 MG PO TABS
1.0000 mg | ORAL_TABLET | Freq: Two times a day (BID) | ORAL | Status: DC | PRN
  Administered 2021-12-02: 22:00:00 1 mg via ORAL
  Filled 2021-12-01: qty 2

## 2021-12-01 NOTE — ED Provider Notes (Signed)
[facility] EMERGENCY DEPARTMENT Provider Note   CSN: [id number] Arrival date & time: 12/01/21  1607     History No chief complaint on file.   Jill Ochoa is a 87 y.o. female with PMH HTN, peripheral vascular disease, T2DM, apparent hospitalizations at [facility] for hyponatremia and a left arm infection who presents the emergency department for evaluation of shortness of breath and abnormal labs.  Patient was seen at a primary care office yesterday with chest heaviness, wheezing satting 85%.  Was given 2 breathing treatments, IM steroids and a Z-Pak and was ultimately discharged home.  Patient had a very mild troponin elevation to 24 (upper limit 20) and was sent to the emergency department for this elevated troponin.  Here in the emergency department she endorses fatigue and mild shortness of breath but denies chest pain, abdominal pain, nausea, vomiting, headache, fever or other systemic symptoms.  HPI     History reviewed. No pertinent past medical history.  Patient Active Problem List   Diagnosis Date Noted   Allergic rhinitis 07/29/2021   Cardiovascular symptoms 07/29/2021   Cellulitis 07/29/2021   Chronic pain syndrome 07/29/2021   Edema 07/29/2021   Essential hypertension 07/29/2021   Gastro-esophageal reflux disease without esophagitis 07/29/2021   Generalized anxiety disorder 07/29/2021   Generalized edema 07/29/2021   History of fall 07/29/2021   Hypothyroidism 07/29/2021   Insomnia 07/29/2021   Long term (current) use of aromatase inhibitors 07/29/2021   Lumbar radicular pain 07/29/2021   Mixed hyperlipidemia 07/29/2021   Osteoarthritis of knee 07/29/2021   Other bursitis, not elsewhere classified, left hip 07/29/2021   Other intervertebral disc degeneration, lumbar region 07/29/2021   Other vitamin B12 deficiency anemias 07/29/2021   Peripheral vascular disease ([place]) 07/29/2021   Peripheral venous insufficiency 07/29/2021    Pressure ulcer of sacral region, stage 1 07/29/2021   Reduced mobility 07/29/2021   Restless legs syndrome 07/29/2021   Type 2 diabetes mellitus without complications ([place]) 0000000   Vertigo of central origin 07/29/2021   Vitamin D deficiency 07/29/2021    History reviewed. No pertinent surgical history.   OB History   No obstetric history on file.     History reviewed. No pertinent family history.     Home Medications Prior to Admission medications   Medication Sig Start Date End Date Taking? Authorizing Provider  acyclovir (ZOVIRAX) 400 MG tablet Take 400 mg by mouth daily. 03/21/21  Yes [provider]  ALPRAZolam Duanne Moron) 1 MG tablet Take 1 mg by mouth 2 (two) times daily as needed for anxiety. 02/23/21  Yes [provider]  amLODipine (NORVASC) 5 MG tablet Take 5 mg by mouth daily. 03/21/21  Yes [provider]  B Complex Vitamins (VITAMIN B COMPLEX) TABS Take 1 tablet by mouth daily.   Yes [provider]  furosemide (LASIX) 20 MG tablet Take 20 mg by mouth daily. 03/21/21  Yes [provider]  gabapentin (NEURONTIN) 800 MG tablet Take 800 mg by mouth 3 (three) times daily. 03/21/21  Yes [provider]  ibuprofen (ADVIL) 800 MG tablet Take 800 mg by mouth every 6 (six) hours as needed for mild pain. 03/21/21  Yes [provider]  lisinopril (ZESTRIL) 20 MG tablet Take 20 mg by mouth daily.   Yes [provider]  meclizine (ANTIVERT) 25 MG tablet Take 25 mg by mouth 2 (two) times daily as needed for dizziness. 08/27/20  Yes [provider]  meloxicam (MOBIC) 15 MG tablet Take  15 mg by mouth daily.   Yes [provider]  metFORMIN (GLUCOPHAGE) 500 MG tablet Take 500 mg by mouth daily. 12/17/20  Yes [provider]  omeprazole (PRILOSEC) 40 MG capsule Take 40 mg by mouth daily. 01/31/21  Yes [provider]  phenazopyridine (PYRIDIUM) 200 MG tablet Take 200 mg by mouth every 8  (eight) hours as needed for pain. 05/21/21  Yes [provider]  predniSONE (DELTASONE) 20 MG tablet Take 20 mg by mouth See admin instructions. Take two tablets by mouth on day one then take 1 tablet by mouth daily per son 12/26/20  Yes [provider]  traMADol (ULTRAM) 50 MG tablet Take 50 mg by mouth every 6 (six) hours as needed for moderate pain. 04/29/21  Yes [provider]  neomycin-polymyxin-hydrocortisone (CORTISPORIN) OTIC solution Apply 1-2 drops to left 1st toenail then cover with a bandaid once daily until healed Patient not taking: Reported on 12/01/2021 07/29/21   Landis Martins, DPM    Allergies    Tape  Review of Systems   Review of Systems  Constitutional:  Negative for chills and fever.  HENT:  Negative for ear pain and sore throat.   Eyes:  Negative for pain and visual disturbance.  Respiratory:  Positive for shortness of breath and wheezing. Negative for cough.   Cardiovascular:  Negative for chest pain and palpitations.  Gastrointestinal:  Negative for abdominal pain and vomiting.  Genitourinary:  Negative for dysuria and hematuria.  Musculoskeletal:  Negative for arthralgias and back pain.  Skin:  Negative for color change and rash.  Neurological:  Negative for seizures and syncope.  All other systems reviewed and are negative.  Physical Exam Updated Vital Signs BP 125/62    Pulse (!) 106    Temp 98.1 F (36.7 C) (Oral)    Resp 17    SpO2 99%   Physical Exam Vitals and nursing note reviewed.  Constitutional:      General: She is not in acute distress.    Appearance: She is well-developed. She is ill-appearing.  HENT:     Head: Normocephalic and atraumatic.  Eyes:     Conjunctiva/sclera: Conjunctivae normal.  Cardiovascular:     Rate and Rhythm: Normal rate and regular rhythm.     Heart sounds: No murmur heard. Pulmonary:     Effort: No respiratory distress.     Breath sounds: Wheezing present.     Comments: Mild increased  respiratory effort with accessory muscle use Abdominal:     Palpations: Abdomen is soft.     Tenderness: There is no abdominal tenderness.  Musculoskeletal:        General: No swelling.     Cervical back: Neck supple.  Skin:    General: Skin is warm and dry.     Capillary Refill: Capillary refill takes less than 2 seconds.  Neurological:     Mental Status: She is alert.  Psychiatric:        Mood and Affect: Mood normal.    ED Results / Procedures / Treatments   Labs (all labs ordered are listed, but only abnormal results are displayed) Labs Reviewed  COMPREHENSIVE METABOLIC PANEL - Abnormal; Notable for the following components:      Result Value   Sodium 132 (*)    Chloride 94 (*)    Glucose, Bld 176 (*)    Creatinine, Ser 1.22 (*)    Calcium 8.4 (*)    Total Protein 5.7 (*)    Albumin  3.1 (*)    GFR, Estimated 42 (*)    All other components within normal limits  CBC WITH DIFFERENTIAL/PLATELET - Abnormal; Notable for the following components:   WBC 14.8 (*)    Hemoglobin 11.9 (*)    Neutro Abs 12.9 (*)    Abs Immature Granulocytes 0.24 (*)    All other components within normal limits  I-STAT VENOUS BLOOD GAS, ED - Abnormal; Notable for the following components:   pH, Ven 7.487 (*)    pCO2, Ven 38.3 (*)    pO2, Ven 46.0 (*)    Bicarbonate 29.0 (*)    Acid-Base Excess 5.0 (*)    Sodium 132 (*)    Calcium, Ion 1.02 (*)    All other components within normal limits  TROPONIN I (HIGH SENSITIVITY) - Abnormal; Notable for the following components:   Troponin I (High Sensitivity) 19 (*)    All other components within normal limits  RESP PANEL BY RT-PCR (FLU A&B, COVID) ARPGX2  BLOOD GAS, VENOUS  TROPONIN I (HIGH SENSITIVITY)    EKG EKG Interpretation  Date/Time:  Thursday December 01 2021 16:52:27 EST Ventricular Rate:  74 PR Interval:  131 QRS Duration: 119 QT Interval:  414 QTC Calculation: 460 R Axis:   8 Text Interpretation: Sinus rhythm Ventricular  premature complex Incomplete left bundle branch block possible wellens with biphasic T wave in V2, no reciprocal depressions or other evidence of ischemia Confirmed by Coltan Spinello ([id number]) on 12/01/2021 5:00:52 PM  Radiology DG Chest Portable 1 View  Result Date: 12/01/2021 CLINICAL DATA:  Evaluate for pneumonia. EXAM: PORTABLE CHEST 1 VIEW COMPARISON:  Chest x-ray 05/25/2021. FINDINGS: Aorta is tortuous in the cardiac silhouette is mildly enlarged, unchanged. There is a stable large hiatal hernia. There is minimal atelectasis in the lung bases. There is some chronic appearing interstitial changes in the right mid lung. There is no pleural effusion or pneumothorax identified. No acute fractures are seen. IMPRESSION: 1. No acute cardiopulmonary process. 2. Stable chronic changes and large hiatal hernia. Electronically Signed   By: Darliss Cheney M.D.   On: 12/01/2021 18:16    Procedures .Critical Care Performed by: Glendora Score, MD Authorized by: Glendora Score, MD   Critical care provider statement:    Critical care time (minutes):  30   Critical care was necessary to treat or prevent imminent or life-threatening deterioration of the following conditions:  Respiratory failure   Critical care was time spent personally by me on the following activities:  Development of treatment plan with patient or surrogate, discussions with consultants, evaluation of patient's response to treatment, examination of patient, ordering and review of laboratory studies, ordering and review of radiographic studies, ordering and performing treatments and interventions, pulse oximetry, re-evaluation of patient's condition and review of old charts   Medications Ordered in ED Medications  methylPREDNISolone sodium succinate (SOLU-MEDROL) 125 mg/2 mL injection 125 mg (125 mg Intravenous Given 12/01/21 1704)  ipratropium-albuterol (DUONEB) 0.5-2.5 (3) MG/3ML nebulizer solution 3 mL (3 mLs Nebulization Given 12/01/21  1704)  ipratropium-albuterol (DUONEB) 0.5-2.5 (3) MG/3ML nebulizer solution 3 mL (3 mLs Nebulization Given 12/01/21 1930)  ipratropium-albuterol (DUONEB) 0.5-2.5 (3) MG/3ML nebulizer solution 3 mL (3 mLs Nebulization Given 12/01/21 2000)    ED Course  I have reviewed the triage vital signs and the nursing notes.  Pertinent labs & imaging results that were available during my care of the patient were reviewed by me and considered in my medical decision making (see chart for details).  Clinical  Course as of 12/01/21 2050  Thu Dec 01, 2021  1624 85% at PCP office  [MK]    Clinical Course User Index [MK] Tavon Corriher, Debe Coder, MD   MDM Rules/Calculators/A&P                          Patient seen emergency department for evaluation of shortness of breath, wheezing.  Physical exam reveals tachypneic patient with accessory muscle use and and expiratory wheezing.  Laboratory evaluation reveals mild hyponatremia 132, hypochloremia to 94, troponin unremarkable, blood gas with a pH of 7.48, PCO2 38.3, COVID and flu negative, leukocytosis to 14.8 which is likely elevated in setting of patient's recent IM steroid use.  Chest x-ray with no pneumonia.  Patient received 3 duo nebs with appropriate improvement of her wheezing, but on reevaluation the patient has persistent wheezing and shortness of breath.  She has maintained her oxygen saturations while on room air but she will require admission for every 4 albuterol's.  Admitted.   Final Clinical Impression(s) / ED Diagnoses Final diagnoses:  None    Rx / DC Orders ED Discharge Orders     None        Harol Shabazz, Debe Coder, MD 12/01/21 2051

## 2021-12-01 NOTE — ED Triage Notes (Signed)
Pt has pna,went to pmd yesterday,  drew labs ,instructed to come to ED.Denies chest pain, Reports dry cough    no fevers.  Ekg at pmd unremarkable

## 2021-12-01 NOTE — H&P (Signed)
History and Physical    Jill Ochoa KDT:[id number] DOB: 04/22/1929 DOA: 12/01/2021  PCP: Galvin Proffer, MD  Patient coming from: Home.  Chief Complaint: Shortness of breath.  HPI: Jill Ochoa is a 87 y.o. female with history of diabetes mellitus, hypertension, chronic pain has been experiencing cough with wheezing for the last 5 days.  Had gone to her PCP yesterday and had labs drawn and was given antibiotics and steroids for acute bronchitis.  Labs showed the patient had abnormal troponins and metabolic panel was instructed to come to the ER.  Patient states she has been short of breath with productive cough with wheezing but denies any chest pain.  Denies any nausea vomiting or diarrhea.  ED Course: In the ER patient is found to be diffusely wheezing with chest x-ray showing nothing acute.  EKG shows normal sinus rhythm with incomplete left bundle branch block.  High sensitive troponins were 19 and 16.  BNP of 97.8.  WBC count 14.8.  Chest x-ray shows nothing acute.  Calcium was low at 8.4 ionized calcium was low at 1.02.  Patient admitted for acute bronchitis.  Was given nebulizer and steroids.  Review of Systems: As per HPI, rest all negative.   Past Medical History:  Diagnosis Date   Hypertension     Past Surgical History:  Procedure Laterality Date   ABDOMINAL HYSTERECTOMY       reports that she has never smoked. She has never used smokeless tobacco. No history on file for alcohol use and drug use.  Allergies  Allergen Reactions   Tape Rash    Family History  Problem Relation Age of Onset   Diabetes Mellitus II Brother     Prior to Admission medications   Medication Sig Start Date End Date Taking? Authorizing Provider  acyclovir (ZOVIRAX) 400 MG tablet Take 400 mg by mouth daily. 03/21/21  Yes [provider]  ALPRAZolam Prudy Feeler) 1 MG tablet Take 1 mg by mouth 2 (two) times daily as needed for anxiety. 02/23/21  Yes [provider]  amLODipine (NORVASC) 5 MG tablet Take 5 mg by mouth daily. 03/21/21  Yes [provider]  B Complex Vitamins (VITAMIN B COMPLEX) TABS Take 1 tablet by mouth daily.   Yes [provider]  furosemide (LASIX) 20 MG tablet Take 20 mg by mouth daily. 03/21/21  Yes [provider]  gabapentin (NEURONTIN) 800 MG tablet Take 800 mg by mouth 3 (three) times daily. 03/21/21  Yes [provider]  ibuprofen (ADVIL) 800 MG tablet Take 800 mg by mouth every 6 (six) hours as needed for mild pain. 03/21/21  Yes [provider]  lisinopril (ZESTRIL) 20 MG tablet Take 20 mg by mouth daily.   Yes [provider]  meclizine (ANTIVERT) 25 MG tablet Take 25 mg by mouth 2 (two) times daily as needed for dizziness. 08/27/20  Yes [provider]  meloxicam (MOBIC) 15 MG tablet Take 15 mg by mouth daily.   Yes [provider]  metFORMIN (GLUCOPHAGE) 500 MG tablet Take 500 mg by mouth daily. 12/17/20  Yes [provider]  omeprazole (PRILOSEC) 40 MG capsule Take 40 mg by mouth daily. 01/31/21  Yes [provider]  phenazopyridine (PYRIDIUM) 200 MG tablet Take 200 mg by mouth every 8 (eight) hours as needed for pain. 05/21/21  Yes [provider]  predniSONE (DELTASONE) 20 MG tablet Take 20 mg by mouth See admin instructions. Take two tablets by  mouth on day one then take 1 tablet by mouth daily per son 12/26/20  Yes [provider]  traMADol (ULTRAM) 50 MG tablet Take 50 mg by mouth every 6 (six) hours as needed for moderate pain. 04/29/21  Yes [provider]  neomycin-polymyxin-hydrocortisone (CORTISPORIN) OTIC solution Apply 1-2 drops to left 1st toenail then cover with a bandaid once daily until healed Patient not taking: Reported on 12/01/2021 07/29/21   Jill Ochoa, DPM    Physical Exam: Constitutional: Moderately built and nourished. Vitals:   12/01/21 1830 12/01/21 1930 12/01/21 2000  12/01/21 2215  BP: 131/73 (!) 147/68 125/62 (!) 112/53  Pulse: 86 87 (!) 106 93  Resp: 20 19 17 15   Temp: 98.1 F (36.7 C)     TempSrc: Oral     SpO2: 96% 98% 99% 92%   Eyes: Anicteric no pallor. ENMT: No discharge from the ears eyes nose and mouth. Neck: No JVD appreciated. Respiratory: Bilateral expiratory wheeze and no crepitations. Cardiovascular: S1-S2 heard. Abdomen: Soft nontender bowel sound present. Musculoskeletal: No edema.  Wears stockings. Skin: Chronic skin changes of the lower extremities. Neurologic: Alert awake oriented time place and person.  Moves all extremities. Psychiatric: Appears normal.  Normal affect.   Labs on Admission: I have personally reviewed following labs and imaging studies  CBC: Recent Labs  Lab 12/01/21 1640 12/01/21 1647  WBC 14.8*  --   NEUTROABS 12.9*  --   HGB 11.9* 12.2  HCT 36.1 36.0  MCV 89.6  --   PLT 242  --    Basic Metabolic Panel: Recent Labs  Lab 12/01/21 1640 12/01/21 1647  NA 132* 132*  K 3.5 3.5  CL 94*  --   CO2 27  --   GLUCOSE 176*  --   BUN 21  --   CREATININE 1.22*  --   CALCIUM 8.4*  --    GFR: CrCl cannot be calculated (Unknown ideal weight.). Liver Function Tests: Recent Labs  Lab 12/01/21 1640  AST 20  ALT 13  ALKPHOS 73  BILITOT 0.5  PROT 5.7*  ALBUMIN 3.1*   No results for input(s): LIPASE, AMYLASE in the last 168 hours. No results for input(s): AMMONIA in the last 168 hours. Coagulation Profile: No results for input(s): INR, PROTIME in the last 168 hours. Cardiac Enzymes: No results for input(s): CKTOTAL, CKMB, CKMBINDEX, TROPONINI in the last 168 hours. BNP (last 3 results) No results for input(s): PROBNP in the last 8760 hours. HbA1C: No results for input(s): HGBA1C in the last 72 hours. CBG: No results for input(s): GLUCAP in the last 168 hours. Lipid Profile: No results for input(s): CHOL, HDL, LDLCALC, TRIG, CHOLHDL, LDLDIRECT in the last 72 hours. Thyroid Function  Tests: No results for input(s): TSH, T4TOTAL, FREET4, T3FREE, THYROIDAB in the last 72 hours. Anemia Panel: No results for input(s): VITAMINB12, FOLATE, FERRITIN, TIBC, IRON, RETICCTPCT in the last 72 hours. Urine analysis: No results found for: COLORURINE, APPEARANCEUR, LABSPEC, PHURINE, GLUCOSEU, HGBUR, BILIRUBINUR, KETONESUR, PROTEINUR, UROBILINOGEN, NITRITE, LEUKOCYTESUR Sepsis Labs: @LABRCNTIP (procalcitonin:4,lacticidven:4) ) Recent Results (from the past 240 hour(s))  Resp Panel by RT-PCR (Flu A&B, Covid) Nasopharyngeal Swab     Status: None   Collection Time: 12/01/21  6:02 PM   Specimen: Nasopharyngeal Swab; Nasopharyngeal(NP) swabs in vial transport medium  Result Value Ref Range Status   SARS Coronavirus 2 by RT PCR NEGATIVE NEGATIVE Final    Comment: (NOTE) SARS-CoV-2 target nucleic acids are NOT DETECTED.  The SARS-CoV-2 RNA is generally detectable in  upper respiratory specimens during the acute phase of infection. The lowest concentration of SARS-CoV-2 viral copies this assay can detect is 138 copies/mL. A negative result does not preclude SARS-Cov-2 infection and should not be used as the sole basis for treatment or other patient management decisions. A negative result may occur with  improper specimen collection/handling, submission of specimen other than nasopharyngeal swab, presence of viral mutation(s) within the areas targeted by this assay, and inadequate number of viral copies(<138 copies/mL). A negative result must be combined with clinical observations, patient history, and epidemiological information. The expected result is Negative.  Fact Sheet for Patients:  BloggerCourse.com  Fact Sheet for Healthcare Providers:  SeriousBroker.it  This test is no t yet approved or cleared by the [place] FDA and  has been authorized for detection and/or diagnosis of SARS-CoV-2 by FDA under an Emergency Use  Authorization (EUA). This EUA will remain  in effect (meaning this test can be used) for the duration of the COVID-19 declaration under Section 564(b)(1) of the Act, 21 U.S.C.section 360bbb-3(b)(1), unless the authorization is terminated  or revoked sooner.       Influenza A by PCR NEGATIVE NEGATIVE Final   Influenza B by PCR NEGATIVE NEGATIVE Final    Comment: (NOTE) The Xpert Xpress SARS-CoV-2/FLU/RSV plus assay is intended as an aid in the diagnosis of influenza from Nasopharyngeal swab specimens and should not be used as a sole basis for treatment. Nasal washings and aspirates are unacceptable for Xpert Xpress SARS-CoV-2/FLU/RSV testing.  Fact Sheet for Patients: BloggerCourse.com  Fact Sheet for Healthcare Providers: SeriousBroker.it  This test is not yet approved or cleared by the [place] FDA and has been authorized for detection and/or diagnosis of SARS-CoV-2 by FDA under an Emergency Use Authorization (EUA). This EUA will remain in effect (meaning this test can be used) for the duration of the COVID-19 declaration under Section 564(b)(1) of the Act, 21 U.S.C. section 360bbb-3(b)(1), unless the authorization is terminated or revoked.  Performed at [facility], [address], [place], [place] [address]      Radiological Exams on Admission: DG Chest Portable 1 View  Result Date: 12/01/2021 CLINICAL DATA:  Evaluate for pneumonia. EXAM: PORTABLE CHEST 1 VIEW COMPARISON:  Chest x-ray 05/25/2021. FINDINGS: Aorta is tortuous in the cardiac silhouette is mildly enlarged, unchanged. There is a stable large hiatal hernia. There is minimal atelectasis in the lung bases. There is some chronic appearing interstitial changes in the right mid lung. There is no pleural effusion or pneumothorax identified. No acute fractures are seen. IMPRESSION: 1. No acute cardiopulmonary process. 2. Stable chronic changes and large hiatal  hernia. Electronically Signed   By: Darliss Cheney M.D.   On: 12/01/2021 18:16    EKG: Independently reviewed.  Normal sinus rhythm with incomplete left bundle branch block.  Assessment/Plan Principal Problem:   Acute bronchitis Active Problems:   Chronic pain syndrome   Essential hypertension    Acute bronchitis -patient is short of breath and diffusely wheezing.  We will keep patient IV steroids nebulizer Pulmicort.  Closely monitor respiratory status. Hypocalcemia ionized calcium is low at 1.02.  EKG does not show any specific changes.  We will check vitamin D and parathormone levels.  I have ordered oral calcium replacement. Hyponatremia baseline not known.  We will closely monitor. Normocytic normochromic anemia -again baseline hemoglobin levels are not known.  We will check anemia panel follow CBC. Hypertension on amlodipine and lisinopril.  I have continued patient's amlodipine but  since patient's creatinine is elevated and do not have any baseline and holding lisinopril for now but if it is stable will restart lisinopril.  As needed IV hydralazine has been added. Acute renal failure versus chronic kidney disease stage III -Baseline creatinine not known.  We will follow metabolic panel closely.  For now hold lisinopril. Patient uses Lasix do not know the exact reason for.  May have to hold if creatinine worsen. Diabetes mellitus type 2 with hyperglycemia with recent use of steroids.  Closely follow CBGs.  If blood sugars markedly elevated will start low-dose Lantus while in the hospital.   DVT prophylaxis: Lovenox. Code Status: Full code. Family Communication: Discussed with patient. Disposition Plan: Home. Consults called: None. Admission status: Observation.   Rise Patience MD [facility] Pager [phone].  If 7PM-7AM, please contact night-coverage [email or web address] Password [facility]  12/01/2021, 10:30 PM

## 2021-12-02 ENCOUNTER — Inpatient Hospital Stay (HOSPITAL_COMMUNITY): Payer: Medicare Other

## 2021-12-02 ENCOUNTER — Observation Stay (HOSPITAL_COMMUNITY): Payer: Medicare Other

## 2021-12-02 DIAGNOSIS — G894 Chronic pain syndrome: Secondary | ICD-10-CM | POA: Diagnosis present

## 2021-12-02 DIAGNOSIS — I447 Left bundle-branch block, unspecified: Secondary | ICD-10-CM | POA: Diagnosis present

## 2021-12-02 DIAGNOSIS — Z79891 Long term (current) use of opiate analgesic: Secondary | ICD-10-CM | POA: Diagnosis not present

## 2021-12-02 DIAGNOSIS — N179 Acute kidney failure, unspecified: Secondary | ICD-10-CM | POA: Diagnosis present

## 2021-12-02 DIAGNOSIS — E878 Other disorders of electrolyte and fluid balance, not elsewhere classified: Secondary | ICD-10-CM | POA: Diagnosis present

## 2021-12-02 DIAGNOSIS — R7989 Other specified abnormal findings of blood chemistry: Secondary | ICD-10-CM

## 2021-12-02 DIAGNOSIS — E1165 Type 2 diabetes mellitus with hyperglycemia: Secondary | ICD-10-CM | POA: Diagnosis present

## 2021-12-02 DIAGNOSIS — J200 Acute bronchitis due to Mycoplasma pneumoniae: Secondary | ICD-10-CM

## 2021-12-02 DIAGNOSIS — Z7952 Long term (current) use of systemic steroids: Secondary | ICD-10-CM | POA: Diagnosis not present

## 2021-12-02 DIAGNOSIS — E860 Dehydration: Secondary | ICD-10-CM | POA: Diagnosis present

## 2021-12-02 DIAGNOSIS — G2581 Restless legs syndrome: Secondary | ICD-10-CM | POA: Diagnosis present

## 2021-12-02 DIAGNOSIS — Z90710 Acquired absence of both cervix and uterus: Secondary | ICD-10-CM | POA: Diagnosis not present

## 2021-12-02 DIAGNOSIS — E039 Hypothyroidism, unspecified: Secondary | ICD-10-CM | POA: Diagnosis present

## 2021-12-02 DIAGNOSIS — F411 Generalized anxiety disorder: Secondary | ICD-10-CM | POA: Diagnosis present

## 2021-12-02 DIAGNOSIS — E782 Mixed hyperlipidemia: Secondary | ICD-10-CM | POA: Diagnosis present

## 2021-12-02 DIAGNOSIS — E1151 Type 2 diabetes mellitus with diabetic peripheral angiopathy without gangrene: Secondary | ICD-10-CM | POA: Diagnosis present

## 2021-12-02 DIAGNOSIS — I10 Essential (primary) hypertension: Secondary | ICD-10-CM | POA: Diagnosis present

## 2021-12-02 DIAGNOSIS — D649 Anemia, unspecified: Secondary | ICD-10-CM | POA: Diagnosis present

## 2021-12-02 DIAGNOSIS — Z7984 Long term (current) use of oral hypoglycemic drugs: Secondary | ICD-10-CM | POA: Diagnosis not present

## 2021-12-02 DIAGNOSIS — Z20822 Contact with and (suspected) exposure to covid-19: Secondary | ICD-10-CM | POA: Diagnosis present

## 2021-12-02 DIAGNOSIS — Z79899 Other long term (current) drug therapy: Secondary | ICD-10-CM | POA: Diagnosis not present

## 2021-12-02 DIAGNOSIS — J309 Allergic rhinitis, unspecified: Secondary | ICD-10-CM | POA: Diagnosis present

## 2021-12-02 DIAGNOSIS — E871 Hypo-osmolality and hyponatremia: Secondary | ICD-10-CM | POA: Diagnosis present

## 2021-12-02 DIAGNOSIS — J209 Acute bronchitis, unspecified: Secondary | ICD-10-CM | POA: Diagnosis present

## 2021-12-02 DIAGNOSIS — I872 Venous insufficiency (chronic) (peripheral): Secondary | ICD-10-CM | POA: Diagnosis present

## 2021-12-02 LAB — TROPONIN I (HIGH SENSITIVITY): Troponin I (High Sensitivity): 18 ng/L — ABNORMAL HIGH (ref <18)

## 2021-12-02 LAB — CBC
HCT: 33.4 % — ABNORMAL LOW (ref 36.0–46.0)
Hemoglobin: 10.8 g/dL — ABNORMAL LOW (ref 12.0–15.0)
MCH: 29.3 pg (ref 26.0–34.0)
MCHC: 32.3 g/dL (ref 30.0–36.0)
MCV: 90.5 fL (ref 80.0–100.0)
Platelets: 221 10*3/uL (ref 150–400)
RBC: 3.69 MIL/uL — ABNORMAL LOW (ref 3.87–5.11)
RDW: 15.6 % — ABNORMAL HIGH (ref 11.5–15.5)
WBC: 12.6 10*3/uL — ABNORMAL HIGH (ref 4.0–10.5)
nRBC: 0 % (ref 0.0–0.2)

## 2021-12-02 LAB — CREATININE, SERUM
Creatinine, Ser: 1.12 mg/dL — ABNORMAL HIGH (ref 0.44–1.00)
GFR, Estimated: 47 mL/min — ABNORMAL LOW (ref >60)

## 2021-12-02 LAB — BASIC METABOLIC PANEL
Anion gap: 12 (ref 5–15)
BUN: 20 mg/dL (ref 8–23)
CO2: 25 mmol/L (ref 22–32)
Calcium: 8.7 mg/dL — ABNORMAL LOW (ref 8.9–10.3)
Chloride: 95 mmol/L — ABNORMAL LOW (ref 98–111)
Creatinine, Ser: 1.05 mg/dL — ABNORMAL HIGH (ref 0.44–1.00)
GFR, Estimated: 50 mL/min — ABNORMAL LOW (ref >60)
Glucose, Bld: 276 mg/dL — ABNORMAL HIGH (ref 70–99)
Potassium: 3.5 mmol/L (ref 3.5–5.1)
Sodium: 132 mmol/L — ABNORMAL LOW (ref 135–145)

## 2021-12-02 LAB — VITAMIN D 25 HYDROXY (VIT D DEFICIENCY, FRACTURES): Vit D, 25-Hydroxy: 48.28 ng/mL (ref 30–100)

## 2021-12-02 LAB — IRON AND TIBC
Iron: 18 ug/dL — ABNORMAL LOW (ref 28–170)
Saturation Ratios: 6 % — ABNORMAL LOW (ref 10.4–31.8)
TIBC: 318 ug/dL (ref 250–450)
UIBC: 300 ug/dL

## 2021-12-02 LAB — RETICULOCYTES
Immature Retic Fract: 23.6 % — ABNORMAL HIGH (ref 2.3–15.9)
RBC.: 3.85 MIL/uL — ABNORMAL LOW (ref 3.87–5.11)
Retic Count, Absolute: 48.5 10*3/uL (ref 19.0–186.0)
Retic Ct Pct: 1.3 % (ref 0.4–3.1)

## 2021-12-02 LAB — GLUCOSE, CAPILLARY
Glucose-Capillary: 156 mg/dL — ABNORMAL HIGH (ref 70–99)
Glucose-Capillary: 201 mg/dL — ABNORMAL HIGH (ref 70–99)
Glucose-Capillary: 235 mg/dL — ABNORMAL HIGH (ref 70–99)

## 2021-12-02 LAB — FOLATE: Folate: 17.4 ng/mL (ref >5.9)

## 2021-12-02 LAB — VITAMIN B12: Vitamin B-12: >7500 pg/mL — ABNORMAL HIGH (ref 180–914)

## 2021-12-02 LAB — PROCALCITONIN: Procalcitonin: 0.67 ng/mL

## 2021-12-02 LAB — BRAIN NATRIURETIC PEPTIDE
B Natriuretic Peptide: 97.8 pg/mL (ref 0.0–100.0)
B Natriuretic Peptide: 220.3 pg/mL — ABNORMAL HIGH (ref 0.0–100.0)

## 2021-12-02 LAB — MAGNESIUM: Magnesium: 2.2 mg/dL (ref 1.7–2.4)

## 2021-12-02 LAB — C-REACTIVE PROTEIN: CRP: 5.6 mg/dL — ABNORMAL HIGH (ref <1.0)

## 2021-12-02 LAB — FERRITIN: Ferritin: 41 ng/mL (ref 11–307)

## 2021-12-02 LAB — D-DIMER, QUANTITATIVE: D-Dimer, Quant: 1.73 ug/mL-FEU — ABNORMAL HIGH (ref 0.00–0.50)

## 2021-12-02 MED ORDER — METHYLPREDNISOLONE SODIUM SUCC 40 MG IJ SOLR
40.0000 mg | INTRAMUSCULAR | Status: DC
  Administered 2021-12-03: 06:00:00 40 mg via INTRAVENOUS
  Filled 2021-12-02: qty 1

## 2021-12-02 MED ORDER — IPRATROPIUM-ALBUTEROL 0.5-2.5 (3) MG/3ML IN SOLN
3.0000 mL | Freq: Four times a day (QID) | RESPIRATORY_TRACT | Status: DC
  Administered 2021-12-02 – 2021-12-03 (×4): 3 mL via RESPIRATORY_TRACT
  Filled 2021-12-02 (×4): qty 3

## 2021-12-02 MED ORDER — INSULIN GLARGINE-YFGN 100 UNIT/ML ~~LOC~~ SOLN
7.0000 [IU] | Freq: Every day | SUBCUTANEOUS | Status: DC
  Administered 2021-12-02 – 2021-12-03 (×2): 7 [IU] via SUBCUTANEOUS
  Filled 2021-12-02 (×2): qty 0.07

## 2021-12-02 MED ORDER — FUROSEMIDE 20 MG PO TABS
20.0000 mg | ORAL_TABLET | Freq: Every day | ORAL | Status: DC
  Administered 2021-12-02: 10:00:00 20 mg via ORAL
  Filled 2021-12-02: qty 1

## 2021-12-02 NOTE — Progress Notes (Signed)
PROGRESS NOTE                                                                                                                                                                                                             Patient Demographics:    Jill Ochoa, is a 87 y.o. female, DOB - Mar 16, 1929, [id number]  Outpatient Primary MD for the patient is Bonnita Nasuti, MD    LOS - 0  Admit date - 12/01/2021    No chief complaint on file.      Brief Narrative (HPI from H&P)    Jill Ochoa is a 87 y.o. female with history of diabetes mellitus, hypertension, chronic pain has been experiencing cough with wheezing for the last 5 days.  Had gone to her PCP yesterday and had labs drawn and was given antibiotics and steroids for acute bronchitis.     Subjective:    Lorelai Merta today has, No headache, No chest pain, No abdominal pain - No Nausea, No new weakness tingling or numbness, +ve cough   Assessment  & Plan :     Acute bronchitis versus early.  Placed on IV steroids and empiric antibiotics, add flutter valve and I-S for pulmonary toiletry, advance activity, leukocytosis improving, will monitor closely with advance activity.  Elderly and frail patient monitor response for another day. Hypocalcemia ionized calcium is low at 1.02.  EKG does not show any specific changes.  We will check vitamin D and parathormone levels.  I have ordered oral calcium replacement.  Repeat calcium levels tomorrow thereafter outpatient PCP directed work-up Hyponatremia baseline not known.  We will closely monitor. Normocytic normochromic anemia -again baseline hemoglobin levels are not known.  We will check anemia panel follow CBC. Hypertension on amlodipine and lisinopril.  I have continued patient's amlodipine but since patient's creatinine is elevated and do not have any baseline and holding lisinopril for now but if it is stable will  restart lisinopril.  As needed IV hydralazine has been added. Acute renal failure versus chronic kidney disease stage III -Baseline creatinine not known.  Due to dehydration improved after hydration and holding of ACE inhibitor. Patient uses Lasix do not know the exact reason for.  Hold home dose Lasix. Elevated D-dimer.  Check leg ultrasound.   Diabetes mellitus type 2 with hyperglycemia with recent use  of steroids.  Closely follow CBGs.  If blood sugars markedly elevated will start low-dose Lantus while in the hospital.   No results found for: HGBA1C CBG (last 3)  Recent Labs    12/02/21 0444 12/02/21 1118  GLUCAP 235* 156*        Condition - Extremely Guarded  Family Communication  : Cay Schillings [phone] on 12/02/2021  Code Status :  Full  Consults  :  None  PUD Prophylaxis : PPI   Procedures  :     Leg [place]      Disposition Plan  :    Status is: Observation  DVT Prophylaxis  :    enoxaparin (LOVENOX) injection 40 mg Start: 12/01/21 2245    Lab Results  Component Value Date   PLT 221 12/01/2021    Diet :  Diet Order             Diet heart healthy/carb modified Room service appropriate? Yes; Fluid consistency: Thin; Fluid restriction: 1200 mL Fluid  Diet effective now                    Inpatient Medications  Scheduled Meds:  acyclovir  400 mg Oral Daily   amLODipine  5 mg Oral Daily   B-complex with vitamin C  1 tablet Oral Daily   budesonide (PULMICORT) nebulizer solution  0.25 mg Nebulization BID   calcium carbonate  1 tablet Oral TID WC   enoxaparin (LOVENOX) injection  40 mg Subcutaneous QHS   gabapentin  800 mg Oral TID   insulin aspart  0-9 Units Subcutaneous TID WC   insulin glargine-yfgn  7 Units Subcutaneous Daily   ipratropium-albuterol  3 mL Nebulization QID   [START ON 12/03/2021] methylPREDNISolone (SOLU-MEDROL) injection  40 mg Intravenous Q24H   pantoprazole  40 mg Oral Daily   Continuous Infusions: PRN Meds:.acetaminophen  **OR** acetaminophen, albuterol, ALPRAZolam, hydrALAZINE, meclizine, traMADol  Antibiotics  :    Anti-infectives (From admission, onward)    Start     Dose/Rate Route Frequency Ordered Stop   12/02/21 1000  acyclovir (ZOVIRAX) tablet 400 mg        400 mg Oral Daily 12/01/21 2229          Time Spent in minutes  30   Susa Raring M.D on 12/02/2021 at 12:07 PM  To page go to [email or web address]   [facility] -  Office  [phone]  See all Orders from today for further details    Objective:   Vitals:   12/01/21 2358 12/02/21 0734 12/02/21 0803 12/02/21 1111  BP: (!) 120/58 139/63    Pulse: 89 87    Resp: 19 17    Temp: 98.2 F (36.8 C) 98 F (36.7 C)    TempSrc: Oral Oral    SpO2: 94%  97% 97%    Wt Readings from Last 3 Encounters:  No data found for Wt     Intake/Output Summary (Last 24 hours) at 12/02/2021 1207 Last data filed at 12/01/2021 2358 Gross per 24 hour  Intake 240 ml  Output --  Net 240 ml     Physical Exam  Awake Alert, No new F.N deficits, Normal affect .AT,PERRAL Supple Neck, No JVD,   Symmetrical Chest wall movement, Good air movement bilaterally, CTAB RRR,No Gallops,Rubs or new Murmurs,  +ve B.Sounds, Abd Soft, No tenderness,   No Cyanosis, Clubbing or edema      Data Review:    CBC Recent Labs  Lab 12/01/21 1640  12/01/21 1647 12/01/21 2327  WBC 14.8*  --  12.6*  HGB 11.9* 12.2 10.8*  HCT 36.1 36.0 33.4*  PLT 242  --  221  MCV 89.6  --  90.5  MCH 29.5  --  29.3  MCHC 33.0  --  32.3  RDW 15.4  --  15.6*  LYMPHSABS 1.2  --   --   MONOABS 0.4  --   --   EOSABS 0.0  --   --   BASOSABS 0.0  --   --     Electrolytes Recent Labs  Lab 12/01/21 1640 12/01/21 1647 12/01/21 2231 12/01/21 2327 12/02/21 0117 12/02/21 0407 12/02/21 0758  NA 132* 132*  --   --  132*  --   --   K 3.5 3.5  --   --  3.5  --   --   CL 94*  --   --   --  95*  --   --   CO2 27  --   --   --  25  --   --   GLUCOSE 176*  --   --   --   276*  --   --   BUN 21  --   --   --  20  --   --   CREATININE 1.22*  --   --  1.12* 1.05*  --   --   CALCIUM 8.4*  --   --   --  8.7*  --   --   AST 20  --   --   --   --   --   --   ALT 13  --   --   --   --   --   --   ALKPHOS 73  --   --   --   --   --   --   BILITOT 0.5  --   --   --   --   --   --   ALBUMIN 3.1*  --   --   --   --   --   --   MG  --   --   --   --   --  2.2  --   CRP  --   --   --   --   --   --  5.6*  DDIMER  --   --   --   --   --   --  1.73*  PROCALCITON  --   --   --   --   --   --  0.67  BNP  --   --  97.8  --   --   --  220.3*    ------------------------------------------------------------------------------------------------------------------ No results for input(s): CHOL, HDL, LDLCALC, TRIG, CHOLHDL, LDLDIRECT in the last 72 hours.  No results found for: HGBA1C  No results for input(s): TSH, T4TOTAL, T3FREE, THYROIDAB in the last 72 hours.  Invalid input(s): FREET3 ------------------------------------------------------------------------------------------------------------------ ID Labs Recent Labs  Lab 12/01/21 1640 12/01/21 2327 12/02/21 0117 12/02/21 0758  WBC 14.8* 12.6*  --   --   PLT 242 221  --   --   CRP  --   --   --  5.6*  DDIMER  --   --   --  1.73*  PROCALCITON  --   --   --  0.67  CREATININE 1.22* 1.12* 1.05*  --    Cardiac Enzymes No results for input(s): CKMB,  TROPONINI, MYOGLOBIN in the last 168 hours.  Invalid input(s): CK   Radiology Reports DG Chest Portable 1 View  Result Date: 12/01/2021 CLINICAL DATA:  Evaluate for pneumonia. EXAM: PORTABLE CHEST 1 VIEW COMPARISON:  Chest x-ray 05/25/2021. FINDINGS: Aorta is tortuous in the cardiac silhouette is mildly enlarged, unchanged. There is a stable large hiatal hernia. There is minimal atelectasis in the lung bases. There is some chronic appearing interstitial changes in the right mid lung. There is no pleural effusion or pneumothorax identified. No acute fractures are seen.  IMPRESSION: 1. No acute cardiopulmonary process. 2. Stable chronic changes and large hiatal hernia. Electronically Signed   By: Ronney Asters M.D.   On: 12/01/2021 18:16

## 2021-12-02 NOTE — Evaluation (Signed)
Occupational Therapy Evaluation Patient Details Name: Jill Ochoa MRN: [id number] DOB: [birth year] Today's Date: 12/02/2021   History of Present Illness 87 y.o. F admitted on 12/22 due to abnormal troponins and metabolic panel, as well as acute bronchitis. PMH significant for DM2, HTN, and chronic pain.   Clinical Impression   Pt admitted for concerns listed above. PTA Pt reported that she was independent with all ADL's and IADL's, using a rollator. At this time, pt is limited by decreased activity tolerance, requiring frequent breaks and min guard for safety. Overall pt is min guard for safety and requiring short bouts of mobility to conserve energy. Recommending HHOT to maximize independence. OT will follow acutely.      Recommendations for follow up therapy are one component of a multi-disciplinary discharge planning process, led by the attending physician.  Recommendations may be updated based on patient status, additional functional criteria and insurance authorization.   Follow Up Recommendations  Home health OT    Assistance Recommended at Discharge Set up Supervision/Assistance  Functional Status Assessment  Patient has had a recent decline in their functional status and demonstrates the ability to make significant improvements in function in a reasonable and predictable amount of time.  Equipment Recommendations  None recommended by OT    Recommendations for Other Services       Precautions / Restrictions Precautions Precautions: Fall Precaution Comments: Watch O2 Restrictions Weight Bearing Restrictions: No      Mobility Bed Mobility Overal bed mobility: Modified Independent                  Transfers Overall transfer level: Needs assistance Equipment used: Rollator (4 wheels) Transfers: Sit to/from Stand Sit to Stand: Min guard                  Balance                                           ADL either  performed or assessed with clinical judgement   ADL Overall ADL's : Needs assistance/impaired Eating/Feeding: Set up;Sitting   Grooming: Set up;Sitting   Upper Body Bathing: Set up;Sitting   Lower Body Bathing: Min guard;Sitting/lateral leans;Sit to/from stand   Upper Body Dressing : Set up;Sitting   Lower Body Dressing: Min guard;Sitting/lateral leans;Sit to/from stand   Toilet Transfer: Min guard;Ambulation   Toileting- Clothing Manipulation and Hygiene: Min guard;Sit to/from stand;Sitting/lateral lean       Functional mobility during ADLs: Min guard;Rollator (4 wheels) General ADL Comments: Pt requiring min guard due to poor activity tolerance, requiring increased assist and rest breaks     Vision Baseline Vision/History: 1 Wears glasses Ability to See in Adequate Light: 0 Adequate Patient Visual Report: No change from baseline Vision Assessment?: No apparent visual deficits     Perception     Praxis      Pertinent Vitals/Pain Pain Assessment: No/denies pain     Hand Dominance Right   Extremity/Trunk Assessment Upper Extremity Assessment Upper Extremity Assessment: Overall WFL for tasks assessed   Lower Extremity Assessment Lower Extremity Assessment: Defer to PT evaluation   Cervical / Trunk Assessment Cervical / Trunk Assessment: Kyphotic   Communication Communication Communication: No difficulties   Cognition Arousal/Alertness: Awake/alert Behavior During Therapy: WFL for tasks assessed/performed Overall Cognitive Status: Within Functional Limits for tasks assessed  General Comments  VSS on RA    Exercises     Shoulder Instructions      Home Living Family/patient expects to be discharged to:: Private residence Living Arrangements: Children Available Help at Discharge: Family;Available PRN/intermittently Type of Home: House Home Access: Stairs to enter;Ramped entrance Entrance  Stairs-Number of Steps: 4 steps Entrance Stairs-Rails: Can reach both Home Layout: One level     Bathroom Shower/Tub: Producer, television/film/video: Handicapped height Bathroom Accessibility: Yes How Accessible: Accessible via walker Home Equipment: Rollator (4 wheels);Shower seat;BSC/3in1          Prior Functioning/Environment Prior Level of Function : Independent/Modified Independent             Mobility Comments: Uses a rollator ADLs Comments: Indep        OT Problem List: Decreased strength;Decreased activity tolerance;Decreased knowledge of use of DME or AE;Cardiopulmonary status limiting activity      OT Treatment/Interventions: Self-care/ADL training;Therapeutic exercise;Energy conservation;DME and/or AE instruction;Therapeutic activities;Patient/family education;Balance training    OT Goals(Current goals can be found in the care plan section) Acute Rehab OT Goals Patient Stated Goal: To go home OT Goal Formulation: With patient Time For Goal Achievement: 12/16/21 Potential to Achieve Goals: Good ADL Goals Pt Will Perform Lower Body Bathing: with modified independence;sitting/lateral leans;sit to/from stand Pt Will Perform Lower Body Dressing: with modified independence;sitting/lateral leans;sit to/from stand Pt Will Transfer to Toilet: with modified independence;ambulating Pt Will Perform Toileting - Clothing Manipulation and hygiene: with modified independence;sitting/lateral leans;sit to/from stand Additional ADL Goal #1: Pt will verbalize 3 energy conservation techniques that she can use at home.  OT Frequency: Min 2X/week   Barriers to D/C:            Co-evaluation              AM-PAC OT "6 Clicks" Daily Activity     Outcome Measure Help from another person eating meals?: A Little Help from another person taking care of personal grooming?: A Little Help from another person toileting, which includes using toliet, bedpan, or urinal?: A  Little Help from another person bathing (including washing, rinsing, drying)?: A Little Help from another person to put on and taking off regular upper body clothing?: A Little Help from another person to put on and taking off regular lower body clothing?: A Little 6 Click Score: 18   End of Session Equipment Utilized During Treatment: Gait belt;Rolling walker (2 wheels) Nurse Communication: Mobility status  Activity Tolerance: Patient tolerated treatment well Patient left: in bed;with call bell/phone within reach;with bed alarm set  OT Visit Diagnosis: Unsteadiness on feet (R26.81);Other abnormalities of gait and mobility (R26.89);Muscle weakness (generalized) (M62.81)                Time: 4196-2229 OT Time Calculation (min): 38 min Charges:  OT General Charges $OT Visit: 1 Visit OT Evaluation $OT Eval Moderate Complexity: 1 Mod OT Treatments $Self Care/Home Management : 23-37 mins  Vanisha Whiten H., OTR/L Acute Rehabilitation  Derian Dimalanta Elane Bing Plume 12/02/2021, 12:29 PM

## 2021-12-02 NOTE — Plan of Care (Signed)
Progressing

## 2021-12-02 NOTE — Progress Notes (Signed)
Lower extremity venous bilateral study completed.   Please see CV Proc for preliminary results.   Paloma Grange, RDMS, RVT  

## 2021-12-02 NOTE — Progress Notes (Signed)
°  Transition of Care [facility]) Screening Note   Patient Details  Name: Jill Ochoa Date of Birth: [birth year]   Transition of Care [facility]) CM/SW Contact:    Mearl Latin, LCSW Phone Number: 12/02/2021, 8:40 AM    Transition of Care Department [facility]) has reviewed patient and no TOC needs have been identified at this time. We will continue to monitor patient advancement through interdisciplinary progression rounds. If new patient transition needs arise, please place a TOC consult.

## 2021-12-02 NOTE — Evaluation (Signed)
Physical Therapy Evaluation Patient Details Name: Jill Ochoa MRN: [id number] DOB: 1929/11/30 Today's Date: 12/02/2021  History of Present Illness  87 y.o. F admitted on 12/22 due to abnormal troponins and metabolic panel, as well as acute bronchitis. PMH significant for DM2, HTN, and chronic pain.  Clinical Impression  Patient presents with decreased mobility due to generalized weakness, decreased balance and poor activity tolerance.  She was previously reliant on walker and needing a little help for ADL's just prior to admission due to weakness.  Family is able to assist and feel she should be able to d/c home with family support and follow up HHPT.        Recommendations for follow up therapy are one component of a multi-disciplinary discharge planning process, led by the attending physician.  Recommendations may be updated based on patient status, additional functional criteria and insurance authorization.  Follow Up Recommendations Home health PT    Assistance Recommended at Discharge Intermittent Supervision/Assistance  Functional Status Assessment Patient has had a recent decline in their functional status and demonstrates the ability to make significant improvements in function in a reasonable and predictable amount of time.  Equipment Recommendations  None recommended by PT    Recommendations for Other Services       Precautions / Restrictions Precautions Precautions: Fall Precaution Comments: Watch O2 Restrictions Weight Bearing Restrictions: No      Mobility  Bed Mobility Overal bed mobility: Modified Independent                  Transfers Overall transfer level: Needs assistance Equipment used: Rolling walker (2 wheels) Transfers: Sit to/from Stand Sit to Stand: Min guard           General transfer comment: up from the EOB then from toilet in bathroom with close S for safety    Ambulation/Gait Ambulation/Gait assistance:  Supervision;Min guard Gait Distance (Feet): 15 Feet (&20' in room) Assistive device: Rolling walker (2 wheels) Gait Pattern/deviations: Step-through pattern;Decreased stride length;Shuffle;Trunk flexed       General Gait Details: limited due to scoliotic deformity and noted SOB  Stairs            Wheelchair Mobility    Modified Rankin (Stroke Patients Only)       Balance Overall balance assessment: Needs assistance Sitting-balance support: Feet supported Sitting balance-Leahy Scale: Good Sitting balance - Comments: completes hygiene after toileting   Standing balance support: Reliant on assistive device for balance Standing balance-Leahy Scale: Poor Standing balance comment: standing at sink to wash hands with minguard A and leaning on sink for support                             Pertinent Vitals/Pain Pain Assessment: Faces Faces Pain Scale: Hurts little more Pain Location: backs of legs where they were hitting the bed Pain Descriptors / Indicators: Sore;Tender Pain Intervention(s): Monitored during session    Home Living Family/patient expects to be discharged to:: Private residence Living Arrangements: Children Available Help at Discharge: Family;Available PRN/intermittently Type of Home: House Home Access: Stairs to enter;Ramped entrance Entrance Stairs-Rails: Can reach both Entrance Stairs-Number of Steps: 4 steps   Home Layout: One level Home Equipment: Rollator (4 wheels);Shower seat;BSC/3in1      Prior Function Prior Level of Function : Independent/Modified Independent             Mobility Comments: Uses a rollator ADLs Comments: needing help with bath from her  daughter in law     Hand Dominance   Dominant Hand: Right    Extremity/Trunk Assessment   Upper Extremity Assessment Upper Extremity Assessment: Defer to OT evaluation    Lower Extremity Assessment Lower Extremity Assessment: Generalized weakness    Cervical /  Trunk Assessment Cervical / Trunk Assessment: Kyphotic (kyphoscoliosis)  Communication   Communication: No difficulties  Cognition Arousal/Alertness: Awake/alert Behavior During Therapy: WFL for tasks assessed/performed Overall Cognitive Status: Within Functional Limits for tasks assessed                                          General Comments General comments (skin integrity, edema, etc.): on RA, SpO2 low 90's after ambulation HR 87    Exercises     Assessment/Plan    PT Assessment Patient needs continued PT services  PT Problem List Decreased strength;Decreased mobility;Decreased activity tolerance;Cardiopulmonary status limiting activity;Decreased safety awareness       PT Treatment Interventions DME instruction;Therapeutic activities;Gait training;Therapeutic exercise;Patient/family education;Functional mobility training;Balance training    PT Goals (Current goals can be found in the Care Plan section)  Acute Rehab PT Goals Patient Stated Goal: to go home tomorrow PT Goal Formulation: With patient Time For Goal Achievement: 12/16/21 Potential to Achieve Goals: Good    Frequency Min 3X/week   Barriers to discharge        Co-evaluation               AM-PAC PT "6 Clicks" Mobility  Outcome Measure Help needed turning from your back to your side while in a flat bed without using bedrails?: None Help needed moving from lying on your back to sitting on the side of a flat bed without using bedrails?: None Help needed moving to and from a bed to a chair (including a wheelchair)?: A Little Help needed standing up from a chair using your arms (e.g., wheelchair or bedside chair)?: A Little Help needed to walk in hospital room?: A Little Help needed climbing 3-5 steps with a railing? : Total 6 Click Score: 18    End of Session Equipment Utilized During Treatment: Gait belt Activity Tolerance: Patient limited by fatigue Patient left: in chair;with  call bell/phone within reach   PT Visit Diagnosis: Muscle weakness (generalized) (M62.81);Difficulty in walking, not elsewhere classified (R26.2)    Time: 5427-0623 PT Time Calculation (min) (ACUTE ONLY): 23 min   Charges:   PT Evaluation $PT Eval Moderate Complexity: 1 Mod PT Treatments $Gait Training: 8-22 mins        Sheran Lawless, PT Acute Rehabilitation Services [phone] Office:[phone] 12/02/2021   Elray Mcgregor 12/02/2021, 3:39 PM

## 2021-12-03 DIAGNOSIS — G894 Chronic pain syndrome: Secondary | ICD-10-CM

## 2021-12-03 DIAGNOSIS — I10 Essential (primary) hypertension: Secondary | ICD-10-CM

## 2021-12-03 LAB — PTH, INTACT AND CALCIUM
Calcium, Total (PTH): 9 mg/dL (ref 8.7–10.3)
PTH: 20 pg/mL (ref 15–65)

## 2021-12-03 LAB — CBC WITH DIFFERENTIAL/PLATELET
Abs Immature Granulocytes: 0.21 10*3/uL — ABNORMAL HIGH (ref 0.00–0.07)
Basophils Absolute: 0 10*3/uL (ref 0.0–0.1)
Basophils Relative: 0 %
Eosinophils Absolute: 0 10*3/uL (ref 0.0–0.5)
Eosinophils Relative: 0 %
HCT: 32.3 % — ABNORMAL LOW (ref 36.0–46.0)
Hemoglobin: 10.5 g/dL — ABNORMAL LOW (ref 12.0–15.0)
Immature Granulocytes: 1 %
Lymphocytes Relative: 8 %
Lymphs Abs: 1.2 10*3/uL (ref 0.7–4.0)
MCH: 28.8 pg (ref 26.0–34.0)
MCHC: 32.5 g/dL (ref 30.0–36.0)
MCV: 88.7 fL (ref 80.0–100.0)
Monocytes Absolute: 0.8 10*3/uL (ref 0.1–1.0)
Monocytes Relative: 5 %
Neutro Abs: 12.5 10*3/uL — ABNORMAL HIGH (ref 1.7–7.7)
Neutrophils Relative %: 86 %
Platelets: 239 10*3/uL (ref 150–400)
RBC: 3.64 MIL/uL — ABNORMAL LOW (ref 3.87–5.11)
RDW: 15.6 % — ABNORMAL HIGH (ref 11.5–15.5)
WBC: 14.7 10*3/uL — ABNORMAL HIGH (ref 4.0–10.5)
nRBC: 0 % (ref 0.0–0.2)

## 2021-12-03 LAB — COMPREHENSIVE METABOLIC PANEL
ALT: 12 U/L (ref 0–44)
AST: 17 U/L (ref 15–41)
Albumin: 2.8 g/dL — ABNORMAL LOW (ref 3.5–5.0)
Alkaline Phosphatase: 58 U/L (ref 38–126)
Anion gap: 9 (ref 5–15)
BUN: 18 mg/dL (ref 8–23)
CO2: 29 mmol/L (ref 22–32)
Calcium: 9 mg/dL (ref 8.9–10.3)
Chloride: 95 mmol/L — ABNORMAL LOW (ref 98–111)
Creatinine, Ser: 0.9 mg/dL (ref 0.44–1.00)
GFR, Estimated: >60 mL/min (ref >60)
Glucose, Bld: 173 mg/dL — ABNORMAL HIGH (ref 70–99)
Potassium: 3.7 mmol/L (ref 3.5–5.1)
Sodium: 133 mmol/L — ABNORMAL LOW (ref 135–145)
Total Bilirubin: 0.3 mg/dL (ref 0.3–1.2)
Total Protein: 5.3 g/dL — ABNORMAL LOW (ref 6.5–8.1)

## 2021-12-03 LAB — C-REACTIVE PROTEIN: CRP: 2.6 mg/dL — ABNORMAL HIGH (ref <1.0)

## 2021-12-03 LAB — GLUCOSE, CAPILLARY: Glucose-Capillary: 148 mg/dL — ABNORMAL HIGH (ref 70–99)

## 2021-12-03 LAB — BRAIN NATRIURETIC PEPTIDE: B Natriuretic Peptide: 297.2 pg/mL — ABNORMAL HIGH (ref 0.0–100.0)

## 2021-12-03 LAB — MAGNESIUM: Magnesium: 2.4 mg/dL (ref 1.7–2.4)

## 2021-12-03 LAB — D-DIMER, QUANTITATIVE: D-Dimer, Quant: 1.37 ug/mL-FEU — ABNORMAL HIGH (ref 0.00–0.50)

## 2021-12-03 LAB — PROCALCITONIN: Procalcitonin: 0.26 ng/mL

## 2021-12-03 LAB — MRSA NEXT GEN BY PCR, NASAL: MRSA by PCR Next Gen: NOT DETECTED

## 2021-12-03 MED ORDER — AZITHROMYCIN 250 MG PO TABS: 250.0000 mg | ORAL_TABLET | Freq: Every day | ORAL | 0 refills | Status: DC

## 2021-12-03 MED ORDER — PREDNISONE 5 MG PO TABS: ORAL_TABLET | ORAL | 0 refills | Status: DC

## 2021-12-03 MED ORDER — ALBUTEROL SULFATE HFA 108 (90 BASE) MCG/ACT IN AERS: 2.0000 | INHALATION_SPRAY | Freq: Four times a day (QID) | RESPIRATORY_TRACT | 0 refills | Status: DC | PRN

## 2021-12-03 MED ORDER — AZITHROMYCIN 500 MG PO TABS
250.0000 mg | ORAL_TABLET | Freq: Once | ORAL | Status: AC
  Administered 2021-12-03: 13:00:00 250 mg via ORAL
  Filled 2021-12-03: qty 1

## 2021-12-03 NOTE — Discharge Summary (Signed)
Jill Ochoa OZY:[id number] DOB: [birth year] DOA: 12/01/2021  PCP: Galvin Proffer, MD  Admit date: 12/01/2021  Discharge date: 12/03/2021  Admitted From: Home   Disposition:  Home   Recommendations for Outpatient Follow-up:   Follow up with PCP in 1-2 weeks  PCP Please obtain BMP/CBC, 2 view CXR in 1week,  (see Discharge instructions)   PCP Please follow up on the following pending results:    Home Health: PT Equipment/Devices: Walker Consultations: None  Discharge Condition: Stable    CODE STATUS: Full    Diet Recommendation: Heart Healthy   Diet Order             Diet - low sodium heart healthy           Diet heart healthy/carb modified Room service appropriate? Yes; Fluid consistency: Thin; Fluid restriction: 1200 mL Fluid  Diet effective now                    CC - Cough    Brief history of present illness from the day of admission and additional interim summary     Jill Ochoa is a 87 y.o. female with history of diabetes mellitus, hypertension, chronic pain has been experiencing cough with wheezing for the last 5 days.  Had gone to her PCP yesterday and had labs drawn and was given antibiotics and steroids for acute bronchitis.                                                                   Hospital Course   Acute bronchitis versus early.  Placed on IV steroids and empiric antibiotics, add flutter valve and I-S for pulmonary toiletry, advance activity, leukocytosis improving, with supportive care clinically much better and stable on room air, eager to go home will be discharged home on few more days of oral antibiotics along with steroid taper, follow with PCP within the  Hypocalcemia mild.  Replaced and stable PCP to monitor. Hyponatremia baseline not known.  To  dehydration hydrated and much improved. Normocytic normochromic anemia -again baseline hemoglobin levels are not known.  PCP due to age appropriate outpatient anemia work-up.Marland Kitchen Hypertension on amlodipine and lisinopril.  They will continue home medication Acute renal failure resolved after IV fluids.. Elevated D-dimer.  Due to mild inflammation, mildly elevated negative leg ultrasound. Diabetes mellitus type 2-continue home regimen.   Discharge diagnosis     Principal Problem:   Acute bronchitis Active Problems:   Chronic pain syndrome   Essential hypertension    Discharge instructions    Discharge Instructions     Diet - low sodium heart healthy   Complete by: As directed    Discharge instructions   Complete by: As directed    Follow with Primary MD Angelina Pih  P, MD in 7 days   Get CBC, CMP, 2 view Chest X ray -  checked next visit within 1 week by Primary MD   Activity: As tolerated with Full fall precautions use walker/cane & assistance as needed  Disposition Home    Diet: Heart Healthy    Special Instructions: If you have smoked or chewed Tobacco  in the last 2 yrs please stop smoking, stop any regular Alcohol  and or any Recreational drug use.  On your next visit with your primary care physician please Get Medicines reviewed and adjusted.  Please request your Prim.MD to go over all Hospital Tests and Procedure/Radiological results at the follow up, please get all Hospital records sent to your Prim MD by signing hospital release before you go home.  If you experience worsening of your admission symptoms, develop shortness of breath, life threatening emergency, suicidal or homicidal thoughts you must seek medical attention immediately by calling 911 or calling your MD immediately  if symptoms less severe.  You Must read complete instructions/literature along with all the possible adverse reactions/side effects for all the Medicines you take and that have been  prescribed to you. Take any new Medicines after you have completely understood and accpet all the possible adverse reactions/side effects.   Increase activity slowly   Complete by: As directed        Discharge Medications   Allergies as of 12/03/2021       Reactions   Tape Rash        Medication List     STOP taking these medications    predniSONE 20 MG tablet Commonly known as: DELTASONE Replaced by: predniSONE 5 MG tablet       TAKE these medications    acyclovir 400 MG tablet Commonly known as: ZOVIRAX Take 400 mg by mouth daily.   albuterol 108 (90 Base) MCG/ACT inhaler Commonly known as: VENTOLIN HFA Inhale 2 puffs into the lungs every 6 (six) hours as needed for wheezing or shortness of breath.   ALPRAZolam 1 MG tablet Commonly known as: XANAX Take 1 mg by mouth 2 (two) times daily as needed for anxiety.   amLODipine 5 MG tablet Commonly known as: NORVASC Take 5 mg by mouth daily.   azithromycin 250 MG tablet Commonly known as: Zithromax Take 1 tablet (250 mg total) by mouth daily.   furosemide 20 MG tablet Commonly known as: LASIX Take 20 mg by mouth daily.   gabapentin 800 MG tablet Commonly known as: NEURONTIN Take 800 mg by mouth 3 (three) times daily.   ibuprofen 800 MG tablet Commonly known as: ADVIL Take 800 mg by mouth every 6 (six) hours as needed for mild pain.   lisinopril 20 MG tablet Commonly known as: ZESTRIL Take 20 mg by mouth daily.   meclizine 25 MG tablet Commonly known as: ANTIVERT Take 25 mg by mouth 2 (two) times daily as needed for dizziness.   meloxicam 15 MG tablet Commonly known as: MOBIC Take 15 mg by mouth daily.   metFORMIN 500 MG tablet Commonly known as: GLUCOPHAGE Take 500 mg by mouth daily.   neomycin-polymyxin-hydrocortisone OTIC solution Commonly known as: CORTISPORIN Apply 1-2 drops to left 1st toenail then cover with a bandaid once daily until healed   omeprazole 40 MG capsule Commonly known  as: PRILOSEC Take 40 mg by mouth daily.   phenazopyridine 200 MG tablet Commonly known as: PYRIDIUM Take 200 mg by mouth every 8 (eight) hours as needed for pain.  predniSONE 5 MG tablet Commonly known as: DELTASONE Label  & dispense according to the schedule below. 6 Pills PO for 3 days, 4 Pills PO for 3 days, 2 Pills PO for 3 days, 1 Pills PO for 3 days, 1/2 Pill  PO for 3 days then STOP. Total 45 pills. Replaces: predniSONE 20 MG tablet   traMADol 50 MG tablet Commonly known as: ULTRAM Take 50 mg by mouth every 6 (six) hours as needed for moderate pain.   Vitamin B Complex Tabs Take 1 tablet by mouth daily.         Follow-up Information     Hague, Myrene Galas, MD. Schedule an appointment as soon as possible for a visit in 1 week(s).   Specialty: Internal Medicine Contact information: [address] [place] [address] [phone]                 Major procedures and Radiology Reports - PLEASE review detailed and final reports thoroughly  -       DG Chest Port 1 View  Result Date: 12/02/2021 CLINICAL DATA:  Pneumonia. EXAM: PORTABLE CHEST 1 VIEW COMPARISON:  12/01/2021 FINDINGS: Low lung volumes. Interstitial markings are diffusely coarsened with chronic features. Cardiopericardial silhouette is at upper limits of normal for size. Hiatal hernia again noted. The visualized bony structures of the thorax show no acute abnormality. Telemetry leads overlie the chest. IMPRESSION: 1. Chronic interstitial lung disease. 2. No acute cardiopulmonary findings. Electronically Signed   By: Kennith Center M.D.   On: 12/02/2021 08:34   DG Chest Portable 1 View  Result Date: 12/01/2021 CLINICAL DATA:  Evaluate for pneumonia. EXAM: PORTABLE CHEST 1 VIEW COMPARISON:  Chest x-ray 05/25/2021. FINDINGS: Aorta is tortuous in the cardiac silhouette is mildly enlarged, unchanged. There is a stable large hiatal hernia. There is minimal atelectasis in the lung bases. There is some  chronic appearing interstitial changes in the right mid lung. There is no pleural effusion or pneumothorax identified. No acute fractures are seen. IMPRESSION: 1. No acute cardiopulmonary process. 2. Stable chronic changes and large hiatal hernia. Electronically Signed   By: Darliss Cheney M.D.   On: 12/01/2021 18:16   VAS [place] LOWER EXTREMITY VENOUS (DVT)  Result Date: 12/02/2021  Lower Venous DVT Study Patient Name:  JENIYAH MENOR [facility]  Date of Exam:   12/02/2021 Medical Rec #: [id number]                 Accession #:    [id number] Date of Birth: 1929/11/01                 Patient Gender: F Patient Age:   69 years Exam Location:  [facility] Procedure:      VAS [place] LOWER EXTREMITY VENOUS (DVT) Referring Phys: Bess Harvest [facility] --------------------------------------------------------------------------------  Indications: D-dimer.  Limitations: Acoustic shadowing due to overlying arterial calcification. Comparison Study: No prior available. Patient reports ablation of bilateral GSV                   and regular use of compression stockings. Patient reports pain                   to bilateral heels and has been told she has "poor                   circulation." Performing Technologist: Jean Rosenthal RDMS, RVT  Examination Guidelines: A complete evaluation includes B-mode imaging, spectral Doppler, color Doppler, and power  Doppler as needed of all accessible portions of each vessel. Bilateral testing is considered an integral part of a complete examination. Limited examinations for reoccurring indications may be performed as noted. The reflux portion of the exam is performed with the patient in reverse Trendelenburg.  +---------+---------------+---------+-----------+----------+-------------------+  RIGHT     Compressibility Phasicity Spontaneity Properties Thrombus Aging       +---------+---------------+---------+-----------+----------+-------------------+  CFV       Full            Yes       Yes                                          +---------+---------------+---------+-----------+----------+-------------------+  FV Prox   Full                                                                  +---------+---------------+---------+-----------+----------+-------------------+  FV Mid    Full                                                                  +---------+---------------+---------+-----------+----------+-------------------+  FV Distal Full                                                                  +---------+---------------+---------+-----------+----------+-------------------+  PFV       Full                                                                  +---------+---------------+---------+-----------+----------+-------------------+  POP       Full            Yes       Yes                                         +---------+---------------+---------+-----------+----------+-------------------+  PTV       Full                                             Not well visualized  +---------+---------------+---------+-----------+----------+-------------------+  PERO      Full  Not well visualized  +---------+---------------+---------+-----------+----------+-------------------+   +---------+---------------+---------+-----------+----------+--------------+  LEFT      Compressibility Phasicity Spontaneity Properties Thrombus Aging  +---------+---------------+---------+-----------+----------+--------------+  CFV       Full            Yes       Yes                                    +---------+---------------+---------+-----------+----------+--------------+  FV Prox   Full                                                             +---------+---------------+---------+-----------+----------+--------------+  FV Mid    Full                                                             +---------+---------------+---------+-----------+----------+--------------+  FV Distal Full                                                              +---------+---------------+---------+-----------+----------+--------------+  PFV       Full                                                             +---------+---------------+---------+-----------+----------+--------------+  POP       Full            Yes       Yes                                    +---------+---------------+---------+-----------+----------+--------------+  PTV       Full                                                             +---------+---------------+---------+-----------+----------+--------------+  PERO                                                       Not visualized  +---------+---------------+---------+-----------+----------+--------------+     Summary: RIGHT: - There is no evidence of deep vein thrombosis in the lower extremity. However, portions of this examination were limited- see technologist comments above.  - No cystic structure found in the popliteal fossa.  LEFT: - There is no evidence of  deep vein thrombosis in the lower extremity. However, portions of this examination were limited- see technologist comments above.  - A cystic structure is found in the popliteal fossa.  *See table(s) above for measurements and observations.    Preliminary        Today   Subjective    Jill Ochoa today has no headache,no chest abdominal pain,no new weakness tingling or numbness, feels much better wants to go home today.     Objective   Blood pressure (!) 124/53, pulse 80, temperature 99.2 F (37.3 C), temperature source Oral, resp. rate 18, height  (1.651 m), weight 54 kg, SpO2 93 %.  No intake or output data in the 24 hours ending 12/03/21 1142  Exam  Awake Alert, No new F.N deficits, Normal affect [place].AT,PERRAL Supple Neck,No JVD, No cervical lymphadenopathy appriciated.  Symmetrical Chest wall movement, Good air movement bilaterally, CTAB RRR,No Gallops,Rubs or new Murmurs, No Parasternal Heave +ve  B.Sounds, Abd Soft, Non tender, No organomegaly appriciated, No rebound -guarding or rigidity. No Cyanosis, Clubbing or edema, No new Rash or bruise   Data Review   CBC w Diff:  Lab Results  Component Value Date   WBC 14.7 (H) 12/03/2021   HGB 10.5 (L) 12/03/2021   HCT 32.3 (L) 12/03/2021   PLT 239 12/03/2021   LYMPHOPCT 8 12/03/2021   MONOPCT 5 12/03/2021   EOSPCT 0 12/03/2021   BASOPCT 0 12/03/2021    CMP:  Lab Results  Component Value Date   NA 133 (L) 12/03/2021   K 3.7 12/03/2021   CL 95 (L) 12/03/2021   CO2 29 12/03/2021   BUN 18 12/03/2021   CREATININE 0.90 12/03/2021   PROT 5.3 (L) 12/03/2021   ALBUMIN 2.8 (L) 12/03/2021   BILITOT 0.3 12/03/2021   ALKPHOS 58 12/03/2021   AST 17 12/03/2021   ALT 12 12/03/2021  .   Total Time in preparing paper work, data evaluation and todays exam - 35 minutes  Susa Raring M.D on 12/03/2021 at 11:42 AM  [facility]

## 2021-12-03 NOTE — TOC Transition Note (Signed)
Transition of Care [facility]) - CM/SW Discharge Note   Patient Details  Name: Jill Ochoa MRN: [id number] Date of Birth: [birth year]  Transition of Care [facility]) CM/SW Contact:  Lawerance Sabal, RN Phone Number: 12/03/2021, 1:07 PM   Clinical Narrative:    Sherron Monday w patient at bedside. She states that she interested in [facility] services. That she has [facility] previously. Spoke w [facility] and they are unable to do start of care until late next week.  Referral placed to [facility]. Patient states that she has a RW and a rollator at home already. Denies need for additional DME.     Final next level of care: Home w Home Health Services Barriers to Discharge: No Barriers Identified   Patient Goals and CMS Choice Patient states their goals for this hospitalization and ongoing recovery are:: to  go home CMS Medicare.gov Compare Post Acute Care list provided to:: Patient Choice offered to / list presented to : Patient  Discharge Placement                       Discharge Plan and Services                DME Arranged: N/A         HH Arranged: PT HH Agency: [facility] Date [facility] Agency Contacted: 12/03/21 Time HH Agency Contacted: 1307 Representative spoke with at [facility] Agency: Kandee Keen  Social Determinants of Health (SDOH) Interventions     Readmission Risk Interventions No flowsheet data found.

## 2021-12-03 NOTE — Plan of Care (Signed)
  Problem: Education: Goal: Knowledge of General Education information will improve Description: Including pain rating scale, medication(s)/side effects and non-pharmacologic comfort measures Outcome: Progressing   Problem: Safety: Goal: Ability to remain free from injury will improve Outcome: Progressing   Problem: Skin Integrity: Goal: Risk for impaired skin integrity will decrease Outcome: Progressing   

## 2021-12-03 NOTE — Discharge Instructions (Signed)
Follow with Primary Jill Ochoa Hague, Jill Ochoa, Jill Ochoa in 7 days  ? ?Get CBC, CMP, 2 view Chest X ray -  checked next visit within 1 week by Primary Jill Ochoa  ? ?Activity: As tolerated with Full fall precautions use walker/cane & assistance as needed ? ?Disposition Home   ? ?Diet: Heart Healthy   ? ?Special Instructions: If you have smoked or chewed Tobacco  in the last 2 yrs please stop smoking, stop any regular Alcohol  and or any Recreational drug use. ? ?On your next visit with your primary care physician please Get Medicines reviewed and adjusted. ? ?Please request your Prim.Jill Ochoa to go over all Hospital Tests and Procedure/Radiological results at the follow up, please get all Hospital records sent to your Prim Jill Ochoa by signing hospital release before you go home. ? ?If you experience worsening of your admission symptoms, develop shortness of breath, life threatening emergency, suicidal or homicidal thoughts you must seek medical attention immediately by calling 911 or calling your Jill Ochoa immediately  if symptoms less severe. ? ?You Must read complete instructions/literature along with all the possible adverse reactions/side effects for all the Medicines you take and that have been prescribed to you. Take any new Medicines after you have completely understood and accpet all the possible adverse reactions/side effects.  ? ?  ?

## 2022-03-27 ENCOUNTER — Encounter: Payer: Self-pay | Admitting: Sports Medicine

## 2022-05-24 ENCOUNTER — Ambulatory Visit: Payer: Medicare Other | Admitting: Sports Medicine

## 2022-09-16 IMAGING — DX DG CHEST 1V PORT
1 series · 1 of 1 positions shown · non-contrast
Comparison: Chest x-ray 05/25/2021.

CLINICAL DATA: Evaluate for pneumonia.

EXAM:
PORTABLE CHEST 1 VIEW

[chest ap]
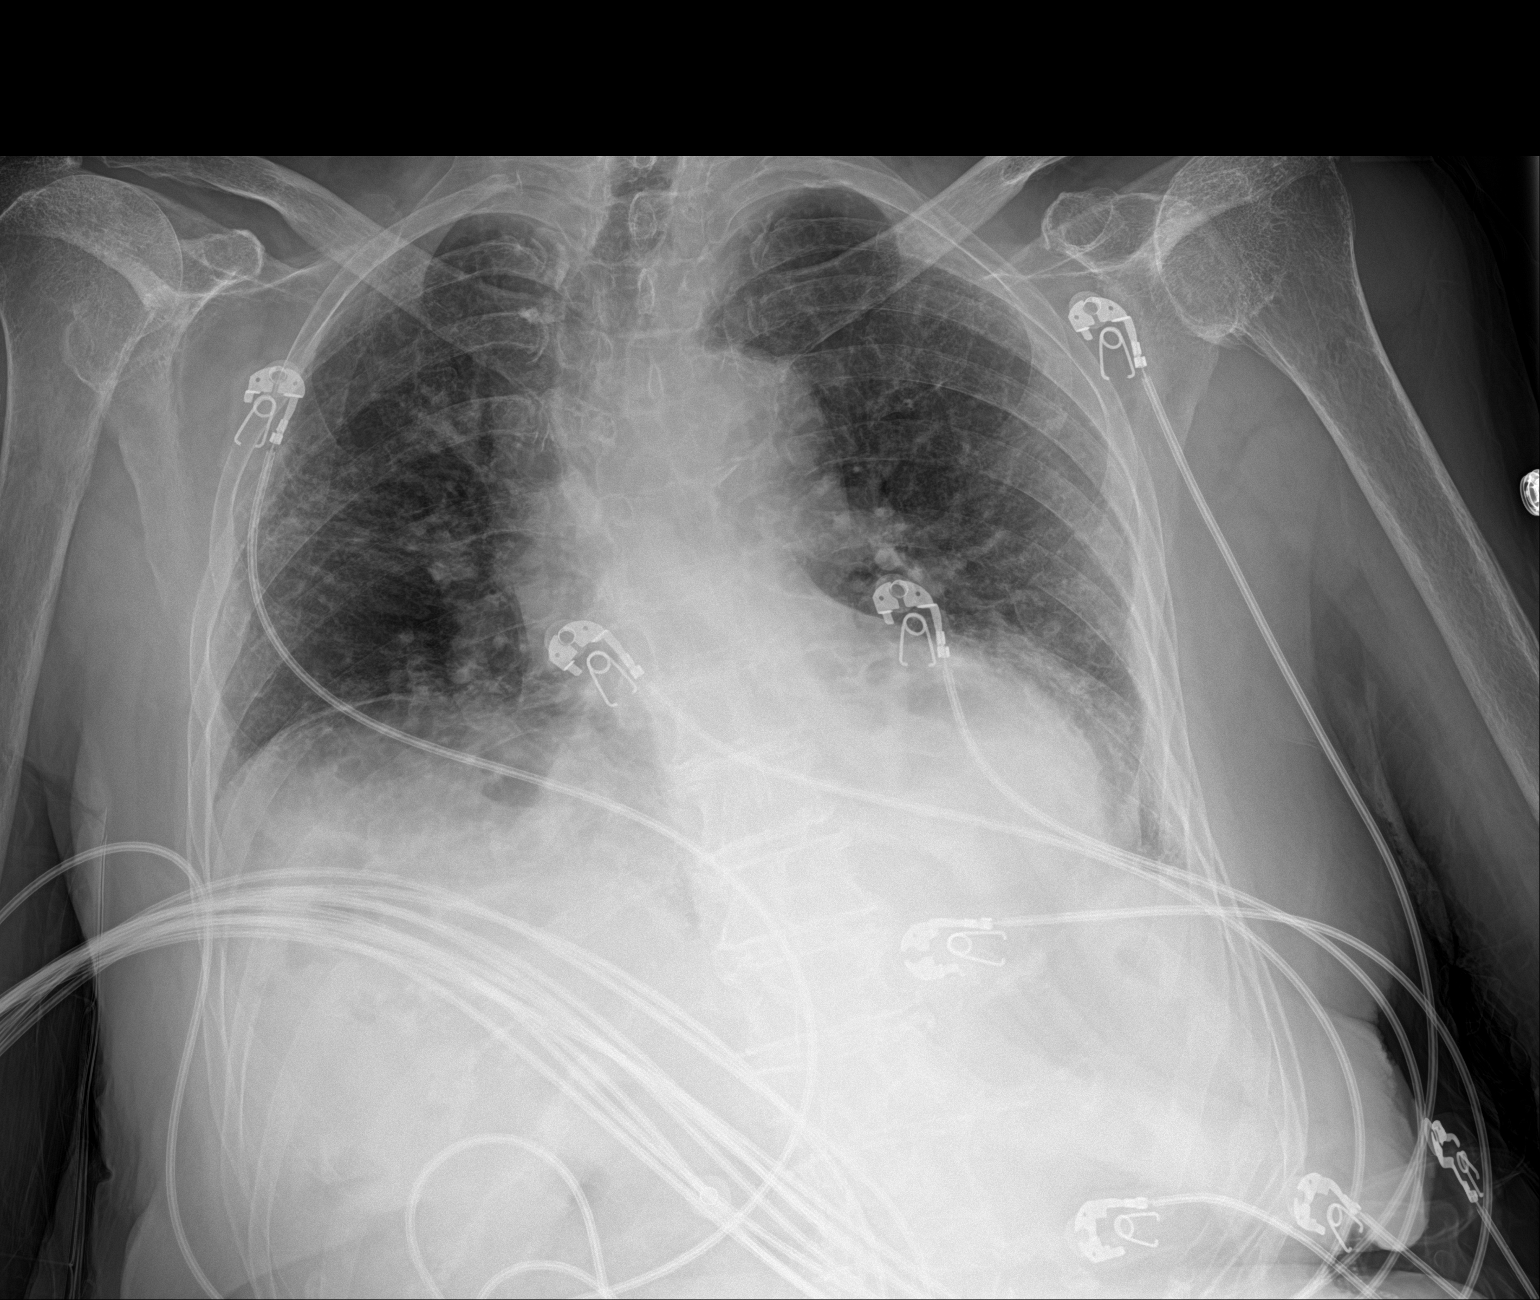

[1 of 1 positions shown; findings below may reference images not displayed]

FINDINGS: Aorta is tortuous in the cardiac silhouette is mildly enlarged,
unchanged. There is a stable large hiatal hernia. There is minimal
atelectasis in the lung bases. There is some chronic appearing
interstitial changes in the right mid lung. There is no pleural
effusion or pneumothorax identified. No acute fractures are seen.
IMPRESSION: 1. No acute cardiopulmonary process.
2. Stable chronic changes and large hiatal hernia.

## 2022-09-17 IMAGING — DX DG CHEST 1V PORT
1 series · 1 of 1 positions shown · non-contrast
Comparison: 12/01/2021

CLINICAL DATA: Pneumonia.

EXAM:
PORTABLE CHEST 1 VIEW

[chest ap]
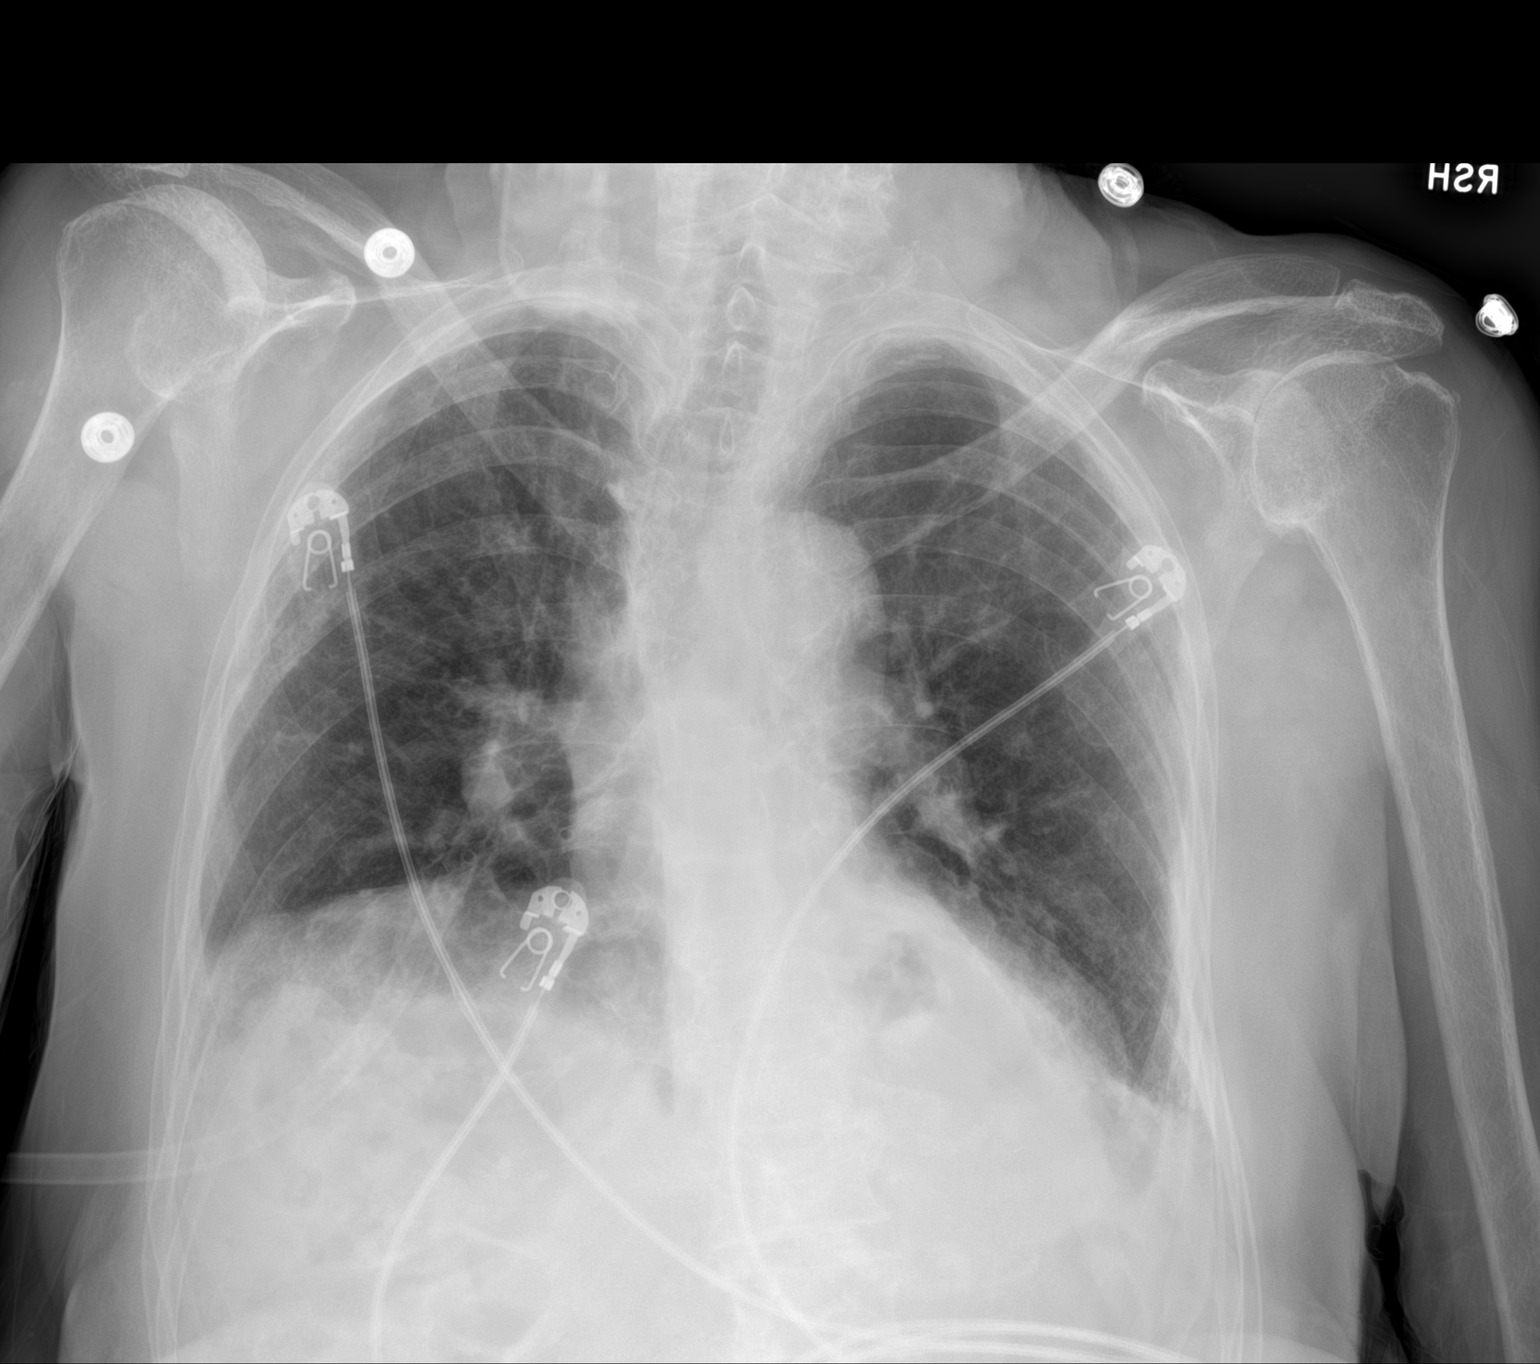

[1 of 1 positions shown; findings below may reference images not displayed]

FINDINGS: Low lung volumes. Interstitial markings are diffusely coarsened with
chronic features. Cardiopericardial silhouette is at upper limits of
normal for size. Hiatal hernia again noted. The visualized bony
structures of the thorax show no acute abnormality. Telemetry leads
overlie the chest.
IMPRESSION: 1. Chronic interstitial lung disease.
2. No acute cardiopulmonary findings.

## 2023-04-05 ENCOUNTER — Telehealth: Payer: Self-pay | Admitting: *Deleted

## 2023-04-05 NOTE — Telephone Encounter (Signed)
        Patient  visited [facility] on 03/31/2023  for treatment   Telephone encounter attempt :  1st  A HIPAA compliant voice message was left requesting a return call.  Instructed patient to call back at [phone].  Jill Ochoa -[facility] [place], Population Health [phone] [address] [place] , [place] [address] Email : [email or web address]

## 2023-05-27 ENCOUNTER — Encounter (HOSPITAL_COMMUNITY): Payer: Self-pay

## 2023-05-27 ENCOUNTER — Other Ambulatory Visit: Payer: Self-pay

## 2023-05-27 ENCOUNTER — Emergency Department (HOSPITAL_COMMUNITY)
Admission: EM | Admit: 2023-05-27 | Discharge: 2023-05-28 | Disposition: A | Discharge status: Home / Self Care | Payer: Medicare Other | Attending: Emergency Medicine | Admitting: Emergency Medicine

## 2023-05-27 ENCOUNTER — Emergency Department (HOSPITAL_COMMUNITY): Payer: Medicare Other

## 2023-05-27 DIAGNOSIS — M6281 Muscle weakness (generalized): Secondary | ICD-10-CM | POA: Diagnosis not present

## 2023-05-27 DIAGNOSIS — M25572 Pain in left ankle and joints of left foot: Secondary | ICD-10-CM | POA: Diagnosis not present

## 2023-05-27 DIAGNOSIS — S99912A Unspecified injury of left ankle, initial encounter: Secondary | ICD-10-CM | POA: Diagnosis present

## 2023-05-27 DIAGNOSIS — S82892A Other fracture of left lower leg, initial encounter for closed fracture: Secondary | ICD-10-CM | POA: Diagnosis not present

## 2023-05-27 DIAGNOSIS — R2689 Other abnormalities of gait and mobility: Secondary | ICD-10-CM | POA: Insufficient documentation

## 2023-05-27 DIAGNOSIS — W1830XA Fall on same level, unspecified, initial encounter: Secondary | ICD-10-CM | POA: Diagnosis not present

## 2023-05-27 LAB — CBC WITH DIFFERENTIAL/PLATELET
Abs Immature Granulocytes: 0.08 10*3/uL — ABNORMAL HIGH (ref 0.00–0.07)
Basophils Absolute: 0.1 10*3/uL (ref 0.0–0.1)
Basophils Relative: 0 %
Eosinophils Absolute: 0.2 10*3/uL (ref 0.0–0.5)
Eosinophils Relative: 1 %
HCT: 40.8 % (ref 36.0–46.0)
Hemoglobin: 13.2 g/dL (ref 12.0–15.0)
Immature Granulocytes: 1 %
Lymphocytes Relative: 17 %
Lymphs Abs: 2.3 10*3/uL (ref 0.7–4.0)
MCH: 31.4 pg (ref 26.0–34.0)
MCHC: 32.4 g/dL (ref 30.0–36.0)
MCV: 96.9 fL (ref 80.0–100.0)
Monocytes Absolute: 1.2 10*3/uL — ABNORMAL HIGH (ref 0.1–1.0)
Monocytes Relative: 9 %
Neutro Abs: 9.5 10*3/uL — ABNORMAL HIGH (ref 1.7–7.7)
Neutrophils Relative %: 72 %
Platelets: 276 10*3/uL (ref 150–400)
RBC: 4.21 MIL/uL (ref 3.87–5.11)
RDW: 12.9 % (ref 11.5–15.5)
WBC: 13.3 10*3/uL — ABNORMAL HIGH (ref 4.0–10.5)
nRBC: 0 % (ref 0.0–0.2)

## 2023-05-27 LAB — BASIC METABOLIC PANEL
Anion gap: 8 (ref 5–15)
BUN: 18 mg/dL (ref 8–23)
CO2: 25 mmol/L (ref 22–32)
Calcium: 8.6 mg/dL — ABNORMAL LOW (ref 8.9–10.3)
Chloride: 99 mmol/L (ref 98–111)
Creatinine, Ser: 0.71 mg/dL (ref 0.44–1.00)
GFR, Estimated: >60 mL/min (ref >60)
Glucose, Bld: 109 mg/dL — ABNORMAL HIGH (ref 70–99)
Potassium: 3.9 mmol/L (ref 3.5–5.1)
Sodium: 132 mmol/L — ABNORMAL LOW (ref 135–145)

## 2023-05-27 MED ORDER — AMLODIPINE BESYLATE 5 MG PO TABS
5.0000 mg | ORAL_TABLET | Freq: Every day | ORAL | Status: DC
  Administered 2023-05-28: 5 mg via ORAL
  Filled 2023-05-27 (×2): qty 1

## 2023-05-27 MED ORDER — METFORMIN HCL 500 MG PO TABS
500.0000 mg | ORAL_TABLET | Freq: Every day | ORAL | Status: DC
  Administered 2023-05-27 – 2023-05-28 (×2): 500 mg via ORAL
  Filled 2023-05-27 (×3): qty 1

## 2023-05-27 MED ORDER — FUROSEMIDE 20 MG PO TABS
20.0000 mg | ORAL_TABLET | Freq: Every day | ORAL | Status: DC
  Administered 2023-05-28: 20 mg via ORAL
  Filled 2023-05-27 (×2): qty 1

## 2023-05-27 MED ORDER — OXYCODONE-ACETAMINOPHEN 5-325 MG PO TABS: 1.0000 | ORAL_TABLET | Freq: Four times a day (QID) | ORAL | 0 refills | Status: DC | PRN

## 2023-05-27 MED ORDER — LISINOPRIL 20 MG PO TABS
20.0000 mg | ORAL_TABLET | Freq: Every day | ORAL | Status: DC
  Administered 2023-05-28: 20 mg via ORAL
  Filled 2023-05-27 (×2): qty 1

## 2023-05-27 MED ORDER — GABAPENTIN 400 MG PO CAPS
800.0000 mg | ORAL_CAPSULE | Freq: Three times a day (TID) | ORAL | Status: DC
  Administered 2023-05-27 – 2023-05-28 (×4): 800 mg via ORAL
  Filled 2023-05-27: qty 8
  Filled 2023-05-27 (×3): qty 2

## 2023-05-27 MED ORDER — TRAMADOL HCL 50 MG PO TABS
50.0000 mg | ORAL_TABLET | Freq: Four times a day (QID) | ORAL | Status: DC | PRN
  Administered 2023-05-27 – 2023-05-28 (×2): 50 mg via ORAL
  Filled 2023-05-27 (×2): qty 1

## 2023-05-27 MED ORDER — MORPHINE SULFATE (PF) 4 MG/ML IV SOLN
4.0000 mg | Freq: Once | INTRAVENOUS | Status: AC
Start: 2023-05-27 — End: 2023-05-27
  Administered 2023-05-27: 4 mg via INTRAVENOUS
  Filled 2023-05-27: qty 1

## 2023-05-27 MED ORDER — PANTOPRAZOLE SODIUM 40 MG PO TBEC
40.0000 mg | DELAYED_RELEASE_TABLET | Freq: Every day | ORAL | Status: DC
  Administered 2023-05-28: 40 mg via ORAL
  Filled 2023-05-27 (×2): qty 1

## 2023-05-27 NOTE — ED Triage Notes (Signed)
Pt bib ems from home c/o fall around 1:00. Pt called son to help place pt back in bed. When pt woke up this morning pt left ankle was notably swollen and deformed laterally. Pt received 50 mcg fentanyl.

## 2023-05-27 NOTE — Progress Notes (Addendum)
@  2:06pm TOC CSW spoke with pts son, Luellen Porteous [phone].  CSW explained to [place] that pt would be returning home with [facility] PT.  TOC CM will be setting this up and will notify them tomorrow with that information.  Kennedy Bucker will be picking pt up at time of discharge.  This information will be shared with RN.  @2 :29pm Further discussion with EDP.  EDP recommending face to face with SW and SNF placement from home.  Cadden Elizondo Tarpley-Carter, MSW, LCSW-A Pronouns:  She/Her/Hers [facility] of Care Clinical Social Worker Direct Number:  [phone] [email or web address]

## 2023-05-27 NOTE — ED Notes (Signed)
Pt family states they are equipped to take care of pt at home today or tomorrow. They would like to consult CSW

## 2023-05-27 NOTE — ED Notes (Signed)
Dinner tray ordered.

## 2023-05-27 NOTE — ED Notes (Signed)
Pt tray at bedside 

## 2023-05-27 NOTE — Progress Notes (Signed)
Orthopedic Tech Progress Note Patient Details:  Jill Ochoa [birth year] [id number]  Ortho Devices Type of Ortho Device: Short leg splint, Stirrup splint Ortho Device/Splint Location: LLE Ortho Device/Splint Interventions: Ordered, Application, Adjustment   Post Interventions Patient Tolerated: Well Instructions Provided: Care of device  [name] 05/27/2023, 2:41 PM

## 2023-05-27 NOTE — ED Notes (Signed)
Ortho notified to apply pt splint

## 2023-05-27 NOTE — Progress Notes (Signed)
TOC CSW spoke with Jill Ochoa, pts son at beside with pt and his wife.  Jill Ochoa is seeking help with pt.  Pts baseline was independent, now pt needs assistance with getting in wheelchair and getting around.  Jill Ochoa does not currently recall who pts HH is with.  TOC CSW presented this information to TOC CM.  TOC CM will find out who pt is currently contracted with for [facility] and make arrangements for continued care.  Jill Ochoa, MSW, LCSW-A Pronouns:  She/Her/Hers [facility] of Care Clinical Social Worker Direct Number:  [phone] [name]

## 2023-05-27 NOTE — Progress Notes (Signed)
Transition of Care [facility]) - Emergency Department Mini Assessment   Patient Details  Name: Jill Ochoa MRN: [id number] Date of Birth: 08/26/1929  Transition of Care [facility]) CM/SW Contact:    Lossie Faes Tarpley-Carter, LCSWA Phone Number: 05/27/2023, 1:15 PM   Clinical Narrative: [facility] CSW consulted with pts son, Xiomara Straka [phone].  Kennyth Arnold is currently out of town on vacation.  Kennyth Arnold states that pts lives at home with his brother Kennedy Bucker and his wife.  Pt has family at bedside.  CSW will consult with them and follow up with a note.  Tylek Boney Tarpley-Carter, MSW, LCSW-A Pronouns:  She/Her/Hers [facility] Clinical Social Worker Direct Number:  [phone] [email or web address]    ED Mini Assessment: What brought you to the Emergency Department? : Fall  Barriers to Discharge: No Barriers Identified     Means of departure: Not know  Interventions which prevented an admission or readmission: SNF Placement    Patient Contact and Communications Key Contact 1: Cathe Mons   Spoke with: Holley Bouche Date: 05/27/23,   Contact time: 1247 Contact Phone Number: [phone] Call outcome: Kennyth Arnold is currently out of town on vacation.  Patient lives at home with Stacy's brother, Kennedy Bucker and his wife.  Patient states their goals for this hospitalization and ongoing recovery are:: Unknown at this time CMS Medicare.gov Compare Post Acute Care list provided to:: Patient Choice offered to / list presented to : Adult Children  Admission diagnosis:  Fall Patient Active Problem List   Diagnosis Date Noted   Acute bronchitis 12/01/2021   Allergic rhinitis 07/29/2021   Cardiovascular symptoms 07/29/2021   Cellulitis 07/29/2021   Chronic pain syndrome 07/29/2021   Edema 07/29/2021   Essential hypertension 07/29/2021   Gastro-esophageal reflux disease without esophagitis 07/29/2021   Generalized anxiety disorder 07/29/2021    Generalized edema 07/29/2021   History of fall 07/29/2021   Hypothyroidism 07/29/2021   Insomnia 07/29/2021   Long term (current) use of aromatase inhibitors 07/29/2021   Lumbar radicular pain 07/29/2021   Mixed hyperlipidemia 07/29/2021   Osteoarthritis of knee 07/29/2021   Other bursitis, not elsewhere classified, left hip 07/29/2021   Other intervertebral disc degeneration, lumbar region 07/29/2021   Other vitamin B12 deficiency anemias 07/29/2021   Peripheral vascular disease (HCC) 07/29/2021   Peripheral venous insufficiency 07/29/2021   Pressure ulcer of sacral region, stage 1 07/29/2021   Reduced mobility 07/29/2021   Restless legs syndrome 07/29/2021   Type 2 diabetes mellitus without complications (HCC) 07/29/2021   Vertigo of central origin 07/29/2021   Vitamin D deficiency 07/29/2021   PCP:  Galvin Proffer, MD Pharmacy:   [facility] [address]  - [address] [place] [address] Phone: [phone] Fax: [phone]

## 2023-05-27 NOTE — [place] FL2 (Signed)
[place] MEDICAID FL2 LEVEL OF CARE FORM     IDENTIFICATION  Patient Name: Anamae Barnick Birthdate: [birth year] Sex: female Admission Date (Current Location): 05/27/2023  [facility] and [place] Number:  Producer, television/film/video and Address:  [facility] [place], [address], [place], [place] [address]      Provider Number: [id number]  Attending Physician Name and Address:  Default, Provider, MD  Relative Name and Phone Number:  Regana Phong [phone]    Current Level of Care: Hospital Recommended Level of Care: Skilled Nursing Facility Prior Approval Number:    Date Approved/Denied:   PASRR Number: [id number]  Discharge Plan: SNF    Current Diagnoses: Patient Active Problem List   Diagnosis Date Noted   Acute bronchitis 12/01/2021   Allergic rhinitis 07/29/2021   Cardiovascular symptoms 07/29/2021   Cellulitis 07/29/2021   Chronic pain syndrome 07/29/2021   Edema 07/29/2021   Essential hypertension 07/29/2021   Gastro-esophageal reflux disease without esophagitis 07/29/2021   Generalized anxiety disorder 07/29/2021   Generalized edema 07/29/2021   History of fall 07/29/2021   Hypothyroidism 07/29/2021   Insomnia 07/29/2021   Long term (current) use of aromatase inhibitors 07/29/2021   Lumbar radicular pain 07/29/2021   Mixed hyperlipidemia 07/29/2021   Osteoarthritis of knee 07/29/2021   Other bursitis, not elsewhere classified, left hip 07/29/2021   Other intervertebral disc degeneration, lumbar region 07/29/2021   Other vitamin B12 deficiency anemias 07/29/2021   Peripheral vascular disease (HCC) 07/29/2021   Peripheral venous insufficiency 07/29/2021   Pressure ulcer of sacral region, stage 1 07/29/2021   Reduced mobility 07/29/2021   Restless legs syndrome 07/29/2021   Type 2 diabetes mellitus without complications (HCC) 07/29/2021   Vertigo of central origin 07/29/2021   Vitamin D deficiency 07/29/2021    Orientation  RESPIRATION BLADDER Height & Weight     Self, Place, Situation  Normal Continent Weight: 112 lb (50.8 kg) Height:  5' (152.4 cm)  BEHAVIORAL SYMPTOMS/MOOD NEUROLOGICAL BOWEL NUTRITION STATUS      Continent Diet (Regular)  AMBULATORY STATUS COMMUNICATION OF NEEDS Skin   Extensive Assist Verbally Normal                       Personal Care Assistance Level of Assistance  Bathing, Feeding, Dressing Bathing Assistance: Limited assistance Feeding assistance: Independent Dressing Assistance: Limited assistance     Functional Limitations Info  Sight, Hearing, Speech Sight Info: Adequate Hearing Info: Adequate Speech Info: Adequate    SPECIAL CARE FACTORS FREQUENCY  PT (By licensed PT), OT (By licensed OT)     PT Frequency: 5 x a week OT Frequency: 5 x a week            Contractures Contractures Info: Not present    Additional Factors Info  Code Status, Allergies Code Status Info: Full Code Allergies Info: Sulfa Antibiotics and Tape           Current Medications (05/27/2023):  This is the current hospital active medication list Current Facility-Administered Medications  Medication Dose Route Frequency Provider Last Rate Last Admin   amLODipine (NORVASC) tablet 5 mg  5 mg Oral Daily Lorre Nick, MD       furosemide (LASIX) tablet 20 mg  20 mg Oral Daily Lorre Nick, MD       gabapentin (NEURONTIN) capsule 800 mg  800 mg Oral TID Lorre Nick, MD       lisinopril (ZESTRIL) tablet 20 mg  20 mg  Oral Daily Lorre Nick, MD       metFORMIN (GLUCOPHAGE) tablet 500 mg  500 mg Oral Daily Lorre Nick, MD       pantoprazole (PROTONIX) EC tablet 40 mg  40 mg Oral Daily Lorre Nick, MD       traMADol Janean Sark) tablet 50 mg  50 mg Oral Q6H PRN Lorre Nick, MD       Current Outpatient Medications  Medication Sig Dispense Refill   oxyCODONE-acetaminophen (PERCOCET/ROXICET) 5-325 MG tablet Take 1 tablet by mouth every 6 (six) hours as needed for severe pain. 15  tablet 0   acyclovir (ZOVIRAX) 400 MG tablet Take 400 mg by mouth daily.     albuterol (VENTOLIN HFA) 108 (90 Base) MCG/ACT inhaler Inhale 2 puffs into the lungs every 6 (six) hours as needed for wheezing or shortness of breath. 6.7 g 0   ALPRAZolam (XANAX) 1 MG tablet Take 1 mg by mouth 2 (two) times daily as needed for anxiety.     amLODipine (NORVASC) 5 MG tablet Take 5 mg by mouth daily.     azithromycin (ZITHROMAX) 250 MG tablet Take 1 tablet (250 mg total) by mouth daily. 4 each 0   B Complex Vitamins (VITAMIN B COMPLEX) TABS Take 1 tablet by mouth daily.     furosemide (LASIX) 20 MG tablet Take 20 mg by mouth daily.     gabapentin (NEURONTIN) 800 MG tablet Take 800 mg by mouth 3 (three) times daily.     ibuprofen (ADVIL) 800 MG tablet Take 800 mg by mouth every 6 (six) hours as needed for mild pain.     lisinopril (ZESTRIL) 20 MG tablet Take 20 mg by mouth daily.     meclizine (ANTIVERT) 25 MG tablet Take 25 mg by mouth 2 (two) times daily as needed for dizziness.     meloxicam (MOBIC) 15 MG tablet Take 15 mg by mouth daily.     metFORMIN (GLUCOPHAGE) 500 MG tablet Take 500 mg by mouth daily.     neomycin-polymyxin-hydrocortisone (CORTISPORIN) OTIC solution Apply 1-2 drops to left 1st toenail then cover with a bandaid once daily until healed (Patient not taking: Reported on 12/01/2021) 10 mL 0   omeprazole (PRILOSEC) 40 MG capsule Take 40 mg by mouth daily.     phenazopyridine (PYRIDIUM) 200 MG tablet Take 200 mg by mouth every 8 (eight) hours as needed for pain.     predniSONE (DELTASONE) 5 MG tablet Label  & dispense according to the schedule below. 6 Pills PO for 3 days, 4 Pills PO for 3 days, 2 Pills PO for 3 days, 1 Pills PO for 3 days, 1/2 Pill  PO for 3 days then STOP. Total 45 pills. 45 tablet 0   traMADol (ULTRAM) 50 MG tablet Take 50 mg by mouth every 6 (six) hours as needed for moderate pain.       Discharge Medications: Please see discharge summary for a list of discharge  medications.  Relevant Imaging Results:  Relevant Lab Results:   Additional Information SSN#:  [id number]          Ht:  5'          Wt:  112 lbs          BMI:  21.87 kg/m2  Garlin Batdorf C Tarpley-Carter, LCSWA

## 2023-05-27 NOTE — ED Provider Notes (Addendum)
[place] EMERGENCY DEPARTMENT AT [facility] Provider Note   CSN: [id number] Arrival date & time: 05/27/23  4166     History  No chief complaint on file.   Jill Ochoa is a 89 y.o. female.  89 year old female here with mechanical fall which occurred earlier this morning.  Patient did not strike her head or lose consciousness.  No chest or abdominal or back discomfort.  Complains of pain to her left ankle and foot.  Initially could not ambulate but not now.  Normally does use a rollator to get around.  EMS was called and patient given fentanyl and transported here.  She does not take any blood thinners       Home Medications Prior to Admission medications   Medication Sig Start Date End Date Taking? Authorizing Provider  acyclovir (ZOVIRAX) 400 MG tablet Take 400 mg by mouth daily. 03/21/21   [provider]  albuterol (VENTOLIN HFA) 108 (90 Base) MCG/ACT inhaler Inhale 2 puffs into the lungs every 6 (six) hours as needed for wheezing or shortness of breath. 12/03/21   Leroy Sea, MD  ALPRAZolam Prudy Feeler) 1 MG tablet Take 1 mg by mouth 2 (two) times daily as needed for anxiety. 02/23/21   [provider]  amLODipine (NORVASC) 5 MG tablet Take 5 mg by mouth daily. 03/21/21   [provider]  azithromycin (ZITHROMAX) 250 MG tablet Take 1 tablet (250 mg total) by mouth daily. 12/03/21   Leroy Sea, MD  B Complex Vitamins (VITAMIN B COMPLEX) TABS Take 1 tablet by mouth daily.    [provider]  furosemide (LASIX) 20 MG tablet Take 20 mg by mouth daily. 03/21/21   [provider]  gabapentin (NEURONTIN) 800 MG tablet Take 800 mg by mouth 3 (three) times daily. 03/21/21   [provider]  ibuprofen (ADVIL) 800 MG tablet Take 800 mg by mouth every 6 (six) hours as needed for mild pain. 03/21/21   [provider]  lisinopril (ZESTRIL) 20 MG tablet Take 20 mg by mouth daily.    [provider]  meclizine (ANTIVERT) 25 MG tablet Take 25 mg by mouth 2 (two) times daily as needed for dizziness. 08/27/20   [provider]  meloxicam (MOBIC) 15 MG tablet Take 15 mg by mouth daily.    [provider]  metFORMIN (GLUCOPHAGE) 500 MG tablet Take 500 mg by mouth daily. 12/17/20   [provider]  neomycin-polymyxin-hydrocortisone (CORTISPORIN) OTIC solution Apply 1-2 drops to left 1st toenail then cover with a bandaid once daily until healed Patient not taking: Reported on 12/01/2021 07/29/21   Asencion Islam, DPM  omeprazole (PRILOSEC) 40 MG capsule Take 40 mg by mouth daily. 01/31/21   [provider]  phenazopyridine (PYRIDIUM) 200 MG tablet Take 200 mg by mouth every 8 (eight) hours as needed for pain. 05/21/21   [provider]  predniSONE (DELTASONE) 5 MG tablet Label  & dispense according to the schedule below. 6 Pills PO for 3 days, 4 Pills PO for 3 days, 2 Pills PO for 3 days, 1 Pills PO for 3 days, 1/2 Pill  PO for 3 days then STOP. Total 45 pills. 12/03/21   Leroy Sea, MD  traMADol (ULTRAM) 50 MG tablet Take 50 mg by mouth every 6 (six) hours as needed for moderate pain. 04/29/21   [provider]      Allergies    Tape    Review of Systems  Review of Systems  All other systems reviewed and are negative.   Physical Exam Updated Vital Signs There were no vitals taken for this visit. Physical Exam Vitals and nursing note reviewed.  Constitutional:      General: She is not in acute distress.    Appearance: Normal appearance. She is well-developed. She is not toxic-appearing.  HENT:     Head: Normocephalic and atraumatic.  Eyes:     General: Lids are normal.     Conjunctiva/sclera: Conjunctivae normal.     Pupils: Pupils are equal, round, and reactive to light.  Neck:     Thyroid: No thyroid mass.     Trachea: No tracheal deviation.  Cardiovascular:     Rate and Rhythm: Normal rate and regular rhythm.     Heart  sounds: Normal heart sounds. No murmur heard.    No gallop.  Pulmonary:     Effort: Pulmonary effort is normal. No respiratory distress.     Breath sounds: Normal breath sounds. No stridor. No decreased breath sounds, wheezing, rhonchi or rales.  Abdominal:     General: There is no distension.     Palpations: Abdomen is soft.     Tenderness: There is no abdominal tenderness. There is no rebound.  Musculoskeletal:     Cervical back: Normal range of motion and neck supple.     Left ankle: Deformity present. Tenderness present. Decreased range of motion.       Feet:     Comments: Neurovasc intact at left foot  Skin:    General: Skin is warm and dry.     Findings: No abrasion or rash.  Neurological:     Mental Status: She is alert and oriented to person, place, and time. Mental status is at baseline.     GCS: GCS eye subscore is 4. GCS verbal subscore is 5. GCS motor subscore is 6.     Cranial Nerves: No cranial nerve deficit.     Sensory: No sensory deficit.     Motor: Motor function is intact.  Psychiatric:        Attention and Perception: Attention normal.        Speech: Speech normal.        Behavior: Behavior normal.     ED Results / Procedures / Treatments   Labs (all labs ordered are listed, but only abnormal results are displayed) Labs Reviewed - No data to display  EKG None  Radiology No results found.  Procedures Procedures    Medications Ordered in ED Medications - No data to display  ED Course/ Medical Decision Making/ A&P                             Medical Decision Making Amount and/or Complexity of Data Reviewed Labs: ordered. Radiology: ordered.  Risk Prescription drug management.   Left ankle and foot x-rays show evidence of osteopenia.  Some widening of the lateral ankle joint space noted.  Patient subsequently had a CT of left ankle which was positive for a displaced lateral malleolar fracture.  Patient placed into splint per orthopedic  tech.  Will be given orthopedic referral.  Outpatient referral also made for social work to help with possible placement which can be done from home.  Labs are reassuring here.  Son is at the bedside and this was discussed with him.  Will discharge back home  2:43 PM Patient was to be discharged to home but  family states that they cannot take care of her.  They requested patient go to rehab for her recent left ankle fracture.  Splint was placed by orthopedic tech.  Long discussion with social work about her current needs.  Patient has been approved by insurance for rehab but most replaced from the ER.  PT consult ordered.  TOC will work on placement.  Home meds reordered        Final Clinical Impression(s) / ED Diagnoses Final diagnoses:  None    Rx / DC Orders ED Discharge Orders     None         Lorre Nick, MD 05/27/23 1307    Lorre Nick, MD 05/27/23 1443

## 2023-05-27 NOTE — Progress Notes (Signed)
TOC CSW has obtained family's permission to Choice during initial conversation.  Pt has been sent out.  The family prefers [facility] in [place] and it is [facility] approved.  Aayansh Codispoti Tarpley-Carter, MSW, LCSW-A Pronouns:  She/Her/Hers [facility] Clinical Social Worker Direct Number:  [phone] Mercedies [email or web address]

## 2023-05-27 NOTE — ED Notes (Signed)
Family at bedside. 

## 2023-05-27 NOTE — ED Notes (Signed)
Pt will be a boarder until insurance is verified that pt can go to a facility. Pt and family are aware of process.

## 2023-05-27 NOTE — Evaluation (Signed)
Physical Therapy Evaluation Patient Details Name: Jill Ochoa MRN: [id number] DOB: May 22, 1929 Today's Date: 05/27/2023  History of Present Illness  89 y.o. female presents to [facility] hospital on 05/27/2023 after a fall, found to have L lateral malleolus fx. PMH significant for DM2, HTN, and chronic pain.  Clinical Impression  Pt presents to PT with deficits in functional mobility, balance, endurance, strength, power. Pt requiring significant assistance to transfer between surfaces, having difficulty maintaining balance and standing without bearing weight through LLE. Pt will benefit from continued PT services in an effort to reduce falls risk and increase independence with transfers. Pt lacks sufficient caregiver support and will benefit from short term inpatient PT placement.       Recommendations for follow up therapy are one component of a multi-disciplinary discharge planning process, led by the attending physician.  Recommendations may be updated based on patient status, additional functional criteria and insurance authorization.  Follow Up Recommendations Can patient physically be transported by private vehicle: No     Assistance Recommended at Discharge Frequent or constant Supervision/Assistance  Patient can return home with the following  A lot of help with walking and/or transfers;A lot of help with bathing/dressing/bathroom;Assistance with cooking/housework;Assist for transportation;Help with stairs or ramp for entrance    Equipment Recommendations  (defer to post-acute)  Recommendations for Other Services       Functional Status Assessment Patient has had a recent decline in their functional status and demonstrates the ability to make significant improvements in function in a reasonable and predictable amount of time.     Precautions / Restrictions Precautions Precautions: Fall Restrictions Weight Bearing Restrictions: Yes LLE Weight Bearing: Non weight bearing       Mobility  Bed Mobility Overal bed mobility: Needs Assistance Bed Mobility: Supine to Sit, Sit to Supine     Supine to sit: Mod assist Sit to supine: Mod assist        Transfers Overall transfer level: Needs assistance Equipment used: Rolling walker (2 wheels), 1 person hand held assist Transfers: Sit to/from Stand, Bed to chair/wheelchair/BSC Sit to Stand: Mod assist, Max assist (modA with RW, maxA with face-to-face) Stand pivot transfers: Mod assist, Max assist (modA with RW, maxA with face-to-face)              Ambulation/Gait                  Stairs            Wheelchair Mobility    Modified Rankin (Stroke Patients Only)       Balance Overall balance assessment: Needs assistance Sitting-balance support: No upper extremity supported, Feet supported Sitting balance-Leahy Scale: Fair     Standing balance support: Bilateral upper extremity supported, Reliant on assistive device for balance Standing balance-Leahy Scale: Poor Standing balance comment: modA                             Pertinent Vitals/Pain Pain Assessment Pain Assessment: Faces Pain Score: 4  Pain Location: LLE Pain Descriptors / Indicators: Sore Pain Intervention(s): Monitored during session    Home Living Family/patient expects to be discharged to:: Private residence Living Arrangements: Children Available Help at Discharge: Family;Available PRN/intermittently Type of Home: House Home Access: Ramped entrance       Home Layout: One level Home Equipment: Rollator (4 wheels);Wheelchair - manual;Hospital bed;BSC/3in1      Prior Function Prior Level of Function : Needs assist  Mobility Comments: ambulates for short household distances with rollator, pt reports rarely leaving the home ADLs Comments: assistance for ADLs from DIL, IADLs from son     Hand Dominance        Extremity/Trunk Assessment   Upper Extremity Assessment Upper  Extremity Assessment: Generalized weakness    Lower Extremity Assessment Lower Extremity Assessment: Generalized weakness    Cervical / Trunk Assessment Cervical / Trunk Assessment: Kyphotic  Communication   Communication: HOH  Cognition Arousal/Alertness: Awake/alert Behavior During Therapy: Anxious Overall Cognitive Status: Within Functional Limits for tasks assessed                                          General Comments General comments (skin integrity, edema, etc.): VSS on RA, pt incontinent of urine prior to transfer to [facility]    Exercises     Assessment/Plan    PT Assessment Patient needs continued PT services  PT Problem List Decreased strength;Decreased activity tolerance;Decreased balance;Decreased mobility;Decreased knowledge of use of DME;Decreased safety awareness;Decreased knowledge of precautions       PT Treatment Interventions DME instruction;Gait training;Functional mobility training;Therapeutic activities;Therapeutic exercise;Balance training;Neuromuscular re-education;Patient/family education;Wheelchair mobility training    PT Goals (Current goals can be found in the Care Plan section)  Acute Rehab PT Goals Patient Stated Goal: to improve balance PT Goal Formulation: With patient Time For Goal Achievement: 06/10/23 Potential to Achieve Goals: Fair Additional Goals Additional Goal #1: Pt will mobilize in a manual wheelchair for >100' with minG to demonstrate improvement in household mobility while maintaining NWB through LLE    Frequency Min 3X/week     Co-evaluation               AM-PAC PT "6 Clicks" Mobility  Outcome Measure Help needed turning from your back to your side while in a flat bed without using bedrails?: A Little Help needed moving from lying on your back to sitting on the side of a flat bed without using bedrails?: A Lot Help needed moving to and from a bed to a chair (including a wheelchair)?: A Lot Help  needed standing up from a chair using your arms (e.g., wheelchair or bedside chair)?: A Lot Help needed to walk in hospital room?: Total Help needed climbing 3-5 steps with a railing? : Total 6 Click Score: 11    End of Session Equipment Utilized During Treatment: Gait belt Activity Tolerance: Patient tolerated treatment well Patient left: in bed (pt on hallway stretcher, no call bell available) Nurse Communication: Mobility status PT Visit Diagnosis: Other abnormalities of gait and mobility (R26.89);Muscle weakness (generalized) (M62.81)    Time: 1610-9604 PT Time Calculation (min) (ACUTE ONLY): 25 min   Charges:   PT Evaluation $PT Eval Low Complexity: 1 Low          Arlyss Gandy, PT, DPT Acute Rehabilitation Office [phone]   Arlyss Gandy 05/27/2023, 4:34 PM

## 2023-05-28 DIAGNOSIS — S82892A Other fracture of left lower leg, initial encounter for closed fracture: Secondary | ICD-10-CM | POA: Diagnosis not present

## 2023-05-28 MED ORDER — ONDANSETRON HCL 4 MG/2ML IJ SOLN
4.0000 mg | Freq: Once | INTRAMUSCULAR | Status: AC
  Administered 2023-05-28: 4 mg via INTRAVENOUS
  Filled 2023-05-28: qty 2

## 2023-05-28 NOTE — ED Notes (Signed)
Writer went to get patient off of bedpan and patient mentioned to Clinical research associate that she has been having intermittent chest pressure (no pain) for past several hours and that she occasionally feels "my heart jerk". Writer connected patient to cardiac monitor and obtained EKG. EKG was given to [place], MD and he was informed of patient's report.

## 2023-05-28 NOTE — Progress Notes (Signed)
CSW spoke with Jill Ochoa in admissions who approved patient for SNF at [facility] in [place]. Patient will be going to room 605.

## 2023-05-28 NOTE — ED Notes (Signed)
Pt has DNR at bedside  

## 2023-05-28 NOTE — Progress Notes (Signed)
CSW presented bed offers to patient and her son Kennedy Bucker at bedside. Family would like [facility] in [place]. CSW explained she can't make any promises and will reach out to admissions. Patients son Kennedy Bucker will make a decision between [facility], [facility], and [facility] if [facility] is not able to accept patient. CSW reached out to [facility] in admissions who will review patient and contact CSW back.

## 2023-06-05 ENCOUNTER — Ambulatory Visit (INDEPENDENT_AMBULATORY_CARE_PROVIDER_SITE_OTHER): Payer: Medicare Other | Admitting: Orthopaedic Surgery

## 2023-06-05 DIAGNOSIS — S8265XA Nondisplaced fracture of lateral malleolus of left fibula, initial encounter for closed fracture: Secondary | ICD-10-CM

## 2023-06-05 NOTE — Progress Notes (Signed)
Office Visit Note   Patient: Jill Ochoa           Date of Birth: 1929-05-05           MRN: [id number] Visit Date: 06/05/2023              Requested by: Galvin Proffer, MD [address] [place],  [place] [address] PCP: Galvin Proffer, MD   Assessment & Plan: Visit Diagnoses:  1. Nondisplaced fracture of lateral malleolus of left fibula, initial encounter for closed fracture     Plan: Jill Ochoa is a 89 year old female with nondisplaced lateral malleolus fracture.  She is minimally ambulatory at baseline with a walker.  I have reviewed the CT scan and this can be treated nonoperatively.  She can weight-bear as tolerated in a cam boot.  I written orders for her to do PT at her nursing facility.  Recheck in 6 weeks with three-view x-rays of the left ankle.  Follow-Up Instructions: No follow-ups on file.   Orders:  No orders of the defined types were placed in this encounter.  No orders of the defined types were placed in this encounter.     Procedures: No procedures performed   Clinical Data: No additional findings.   Subjective: Chief Complaint  Patient presents with   Left Ankle - Routine Post Op    HPI Patient is a 89 year old female follow-up from the ER for left ankle injury on 05/27/2023.  Currently residing at [facility].  Jill Ochoa is present today.  Patient is minimally ambulatory with a walker at baseline.  Review of Systems  Constitutional: Negative.   HENT: Negative.    Eyes: Negative.   Respiratory: Negative.    Cardiovascular: Negative.   Endocrine: Negative.   Musculoskeletal: Negative.   Neurological: Negative.   Hematological: Negative.   Psychiatric/Behavioral: Negative.    All other systems reviewed and are negative.    Objective: Vital Signs: There were no vitals taken for this visit.  Physical Exam Vitals and nursing note reviewed.  Constitutional:      Appearance: She is well-developed.  HENT:     Head:  Atraumatic.     Nose: Nose normal.  Eyes:     Extraocular Movements: Extraocular movements intact.  Cardiovascular:     Pulses: Normal pulses.  Pulmonary:     Effort: Pulmonary effort is normal.  Abdominal:     Palpations: Abdomen is soft.  Musculoskeletal:     Cervical back: Neck supple.  Skin:    General: Skin is warm.     Capillary Refill: Capillary refill takes less than 2 seconds.  Neurological:     Mental Status: She is alert. Mental status is at baseline.  Psychiatric:        Behavior: Behavior normal.        Thought Content: Thought content normal.        Judgment: Judgment normal.     Ortho Exam Examination left ankle and lower extremity shows extensive ecchymosis and swelling.  There is no neurovascular compromise.  No motor or sensory deficits. Specialty Comments:  No specialty comments available.  Imaging: No results found.   PMFS History: Patient Active Problem List   Diagnosis Date Noted   Acute bronchitis 12/01/2021   Allergic rhinitis 07/29/2021   Cardiovascular symptoms 07/29/2021   Cellulitis 07/29/2021   Chronic pain syndrome 07/29/2021   Edema 07/29/2021   Essential hypertension 07/29/2021   Gastro-esophageal reflux disease without esophagitis 07/29/2021  Generalized anxiety disorder 07/29/2021   Generalized edema 07/29/2021   History of fall 07/29/2021   Hypothyroidism 07/29/2021   Insomnia 07/29/2021   Long term (current) use of aromatase inhibitors 07/29/2021   Lumbar radicular pain 07/29/2021   Mixed hyperlipidemia 07/29/2021   Osteoarthritis of knee 07/29/2021   Other bursitis, not elsewhere classified, left hip 07/29/2021   Other intervertebral disc degeneration, lumbar region 07/29/2021   Other vitamin B12 deficiency anemias 07/29/2021   Peripheral vascular disease (HCC) 07/29/2021   Peripheral venous insufficiency 07/29/2021   Pressure ulcer of sacral region, stage 1 07/29/2021   Reduced mobility 07/29/2021   Restless legs  syndrome 07/29/2021   Type 2 diabetes mellitus without complications (HCC) 07/29/2021   Vertigo of central origin 07/29/2021   Vitamin D deficiency 07/29/2021   Past Medical History:  Diagnosis Date   Hypertension     Family History  Problem Relation Age of Onset   Diabetes Mellitus II Brother     Past Surgical History:  Procedure Laterality Date   ABDOMINAL HYSTERECTOMY     Social History   Occupational History   Not on file  Tobacco Use   Smoking status: Never   Smokeless tobacco: Never  Substance and Sexual Activity   Alcohol use: Not on file   Drug use: Not on file   Sexual activity: Not on file

## 2023-07-17 ENCOUNTER — Ambulatory Visit: Payer: Medicare Other | Admitting: Orthopaedic Surgery

## 2023-10-03 DIAGNOSIS — I350 Nonrheumatic aortic (valve) stenosis: Secondary | ICD-10-CM | POA: Diagnosis not present

## 2023-10-03 DIAGNOSIS — I342 Nonrheumatic mitral (valve) stenosis: Secondary | ICD-10-CM | POA: Diagnosis not present

## 2024-11-10 DEATH — deceased
